# Patient Record
Sex: Male | Born: 1975 | Race: White | Hispanic: No | Marital: Married | Smoking: Former smoker
Health system: Southern US, Community
[De-identification: ages and names within clinical notes are randomized; demographics above are authoritative.]

## PROBLEM LIST (undated history)

## (undated) DIAGNOSIS — M109 Gout, unspecified: Secondary | ICD-10-CM

## (undated) DIAGNOSIS — E669 Obesity, unspecified: Secondary | ICD-10-CM

## (undated) DIAGNOSIS — E7800 Pure hypercholesterolemia, unspecified: Secondary | ICD-10-CM

## (undated) DIAGNOSIS — K219 Gastro-esophageal reflux disease without esophagitis: Secondary | ICD-10-CM

## (undated) HISTORY — PX: ANAL FISTULOTOMY: SHX6423

## (undated) HISTORY — PX: HERNIA REPAIR: SHX51

---

## 1999-09-23 ENCOUNTER — Emergency Department (HOSPITAL_COMMUNITY): Admission: EM | Admit: 1999-09-23 | Discharge: 1999-09-23 | Payer: Self-pay | Admitting: Emergency Medicine

## 1999-09-23 ENCOUNTER — Encounter: Payer: Self-pay | Admitting: Emergency Medicine

## 2013-01-25 ENCOUNTER — Emergency Department (HOSPITAL_COMMUNITY): Payer: BC Managed Care – PPO

## 2013-01-25 ENCOUNTER — Emergency Department (HOSPITAL_COMMUNITY)
Admission: EM | Admit: 2013-01-25 | Discharge: 2013-01-25 | Disposition: A | Discharge status: Home / Self Care | Payer: BC Managed Care – PPO | Attending: Emergency Medicine | Admitting: Emergency Medicine

## 2013-01-25 ENCOUNTER — Encounter (HOSPITAL_COMMUNITY): Payer: Self-pay | Admitting: *Deleted

## 2013-01-25 ENCOUNTER — Other Ambulatory Visit: Payer: Self-pay

## 2013-01-25 DIAGNOSIS — E7800 Pure hypercholesterolemia, unspecified: Secondary | ICD-10-CM | POA: Insufficient documentation

## 2013-01-25 DIAGNOSIS — R002 Palpitations: Secondary | ICD-10-CM

## 2013-01-25 DIAGNOSIS — R0602 Shortness of breath: Secondary | ICD-10-CM | POA: Insufficient documentation

## 2013-01-25 DIAGNOSIS — Z87891 Personal history of nicotine dependence: Secondary | ICD-10-CM | POA: Insufficient documentation

## 2013-01-25 DIAGNOSIS — R42 Dizziness and giddiness: Secondary | ICD-10-CM | POA: Insufficient documentation

## 2013-01-25 DIAGNOSIS — R079 Chest pain, unspecified: Secondary | ICD-10-CM | POA: Insufficient documentation

## 2013-01-25 DIAGNOSIS — Z79899 Other long term (current) drug therapy: Secondary | ICD-10-CM | POA: Insufficient documentation

## 2013-01-25 HISTORY — DX: Pure hypercholesterolemia, unspecified: E78.00

## 2013-01-25 LAB — HEPATIC FUNCTION PANEL
Bilirubin, Direct: 0.1 mg/dL (ref 0.0–0.3)
Indirect Bilirubin: 0.4 mg/dL (ref 0.3–0.9)

## 2013-01-25 LAB — CBC WITH DIFFERENTIAL/PLATELET
Basophils Relative: 1 % (ref 0–1)
Eosinophils Absolute: 0.1 10*3/uL (ref 0.0–0.7)
Hemoglobin: 16.3 g/dL (ref 13.0–17.0)
MCH: 32.1 pg (ref 26.0–34.0)
MCHC: 35.7 g/dL (ref 30.0–36.0)
Monocytes Absolute: 0.5 10*3/uL (ref 0.1–1.0)
Monocytes Relative: 11 % (ref 3–12)
Neutrophils Relative %: 66 % (ref 43–77)

## 2013-01-25 LAB — BASIC METABOLIC PANEL
BUN: 11 mg/dL (ref 6–23)
Calcium: 9.4 mg/dL (ref 8.4–10.5)
Creatinine, Ser: 0.95 mg/dL (ref 0.50–1.35)
GFR calc Af Amer: >90 mL/min (ref >90)
GFR calc non Af Amer: >90 mL/min (ref >90)
Potassium: 3.5 mEq/L (ref 3.5–5.1)

## 2013-01-25 MED ORDER — LORAZEPAM 1 MG PO TABS
1.0000 mg | ORAL_TABLET | Freq: Once | ORAL | Status: AC
  Administered 2013-01-25: 1 mg via ORAL
  Filled 2013-01-25: qty 1

## 2013-01-25 MED ORDER — METOPROLOL TARTRATE 25 MG PO TABS: ORAL_TABLET | ORAL | Status: DC

## 2013-01-25 NOTE — ED Notes (Signed)
Pt with mild SOB at this time

## 2013-01-25 NOTE — ED Provider Notes (Signed)
CSN: [id number]     Arrival date & time 01/25/13  1213 History    This chart was scribed for Marc Lennert, MD by Blanchard Kelch, ED Scribe. The patient was seen in room APA10/APA10. Patient's care was started at 1:23 PM.     Chief Complaint  Patient presents with  . Chest Pain  . Palpitations    Patient is a 37 y.o. male presenting with chest pain and palpitations. The history is provided by the patient. No language interpreter was used.  Chest Pain Pain radiates to the back: no   Pain severity:  Moderate Onset quality:  Sudden Duration:  3 weeks Timing:  Sporadic Progression:  Unchanged Chronicity:  New Associated symptoms: dizziness, palpitations and shortness of breath   Associated symptoms: no abdominal pain, no back pain, no cough, no fatigue and no headache   Palpitations Associated symptoms: dizziness and shortness of breath   Associated symptoms: no back pain, no chest pain and no cough     HPI Comments: Marc Black is a 37 y.o. male who presents to the Emergency Department complaining of ongoing intermittent episodes of dizziness with associated palpitations and shortness of breath, which began 2-3 weeks ago. The palpitation episodes last about 10 seconds and are described as "his heart flopping around " and the shortness of breath lasts up to a minute. He also complains of being light headed. Patient has noticed a few episodes of palpitations today while in the ED. Patient states that he stopped drinking coffee a month ago and reports drinking a 20 oz soda today. He is on medication for high cholesterol. Patient had stress test 5 years ago because of previous chest pain that he believes came out negative. Grandfather has history of heart issues and aunt had MI before she was 38, but other immediate family members are negative for any heart problems. Pt denies smoking and alcohol use.  PCP is Dr. Sherryll Burger.   Past Medical History  Diagnosis Date  . High cholesterol     Past Surgical History  Procedure Laterality Date  . Anal fistulotomy     History reviewed. No pertinent family history. History  Substance Use Topics  . Smoking status: Former Games developer  . Smokeless tobacco: Not on file  . Alcohol Use: No    Review of Systems  Constitutional: Negative for appetite change and fatigue.  HENT: Negative for congestion, sinus pressure and ear discharge.   Eyes: Negative for discharge.  Respiratory: Positive for shortness of breath. Negative for cough.   Cardiovascular: Positive for palpitations. Negative for chest pain.  Gastrointestinal: Negative for abdominal pain and diarrhea.  Genitourinary: Negative for frequency and hematuria.  Musculoskeletal: Negative for back pain.  Skin: Negative for rash.  Neurological: Positive for dizziness and light-headedness. Negative for seizures and headaches.  Psychiatric/Behavioral: Negative for hallucinations.    Allergies  Morphine and related  Home Medications   Current Outpatient Rx  Name  Route  Sig  Dispense  Refill  . gemfibrozil (LOPID) 600 MG tablet   Oral   Take 600 mg by mouth 2 (two) times daily before a meal.         . omeprazole (PRILOSEC) 20 MG capsule   Oral   Take 20 mg by mouth daily as needed. OTC medication          Triage Vitals: BP 146/86  Pulse 79  Resp 13  SpO2 100%  Physical Exam  Nursing note and vitals reviewed. Constitutional: He is oriented to  person, place, and time. He appears well-developed.  HENT:  Head: Normocephalic.  Eyes: Conjunctivae and EOM are normal. No scleral icterus.  Neck: Neck supple. No thyromegaly present.  Cardiovascular: Normal rate and regular rhythm.  Exam reveals no gallop and no friction rub.   No murmur heard. Pulmonary/Chest: No stridor. He has no wheezes. He has no rales. He exhibits no tenderness.  Abdominal: He exhibits no distension. There is no tenderness. There is no rebound.  Musculoskeletal: Normal range of motion. He exhibits  no edema.  Lymphadenopathy:    He has no cervical adenopathy.  Neurological: He is oriented to person, place, and time. Coordination normal.  Skin: No rash noted. No erythema.  Psychiatric: He has a normal mood and affect. His behavior is normal.    ED Course   DIAGNOSTIC STUDIES:  Oxygen Saturation is 100% on room air, normal by my interpretation.    COORDINATION OF CARE:  1:27 PM - Patient verbalizes understanding and agrees with treatment plan.   Procedures (including critical care time)  Labs Reviewed  BASIC METABOLIC PANEL - Abnormal; Notable for the following:    Glucose, Bld 105 (*)    All other components within normal limits  TROPONIN I  CBC WITH DIFFERENTIAL  HEPATIC FUNCTION PANEL   Dg Chest Portable 1 View  01/25/2013   *RADIOLOGY REPORT*  Clinical Data: Chest pain  PORTABLE CHEST - 1 VIEW  Comparison: None.  Findings: The heart and pulmonary vascularity are within normal limits.  The lungs are clear bilaterally.  No focal infiltrate is seen.  No bony abnormality is noted.  An old right clavicle fracture is seen.  IMPRESSION: No acute abnormality noted.   Original Report Authenticated By: Alcide Clever, M.D.   No diagnosis found.  Date: 01/25/2013  Rate: 74  Rhythm: normal sinus rhythm  QRS Axis: normal  Intervals: normal  ST/T Wave abnormalities: normal  Conduction Disutrbances:none  Narrative Interpretation:   Old EKG Reviewed: unchanged   MDM  Palpitatios,  Possible pac,  Will refer to cardiology The chart was scribed for me under my direct supervision.  I personally performed the history, physical, and medical decision making and all procedures in the evaluation of this patient.Marc Lennert, MD 01/25/13 [phone]

## 2013-01-25 NOTE — ED Notes (Signed)
Pt with CP and palpitations for 2 weeks with SOB with exertion at times, mid chest that radiates to back at times

## 2013-01-25 NOTE — ED Notes (Signed)
C/o dizziness at this time, denies CP

## 2013-08-21 ENCOUNTER — Encounter (HOSPITAL_COMMUNITY): Payer: Self-pay | Admitting: Emergency Medicine

## 2013-08-21 ENCOUNTER — Emergency Department (HOSPITAL_COMMUNITY): Payer: BC Managed Care – PPO

## 2013-08-21 ENCOUNTER — Emergency Department (HOSPITAL_COMMUNITY)
Admission: EM | Admit: 2013-08-21 | Discharge: 2013-08-21 | Disposition: A | Discharge status: Home / Self Care | Payer: BC Managed Care – PPO | Attending: Emergency Medicine | Admitting: Emergency Medicine

## 2013-08-21 DIAGNOSIS — M109 Gout, unspecified: Secondary | ICD-10-CM | POA: Insufficient documentation

## 2013-08-21 DIAGNOSIS — R1110 Vomiting, unspecified: Secondary | ICD-10-CM

## 2013-08-21 DIAGNOSIS — Z79899 Other long term (current) drug therapy: Secondary | ICD-10-CM | POA: Insufficient documentation

## 2013-08-21 DIAGNOSIS — K219 Gastro-esophageal reflux disease without esophagitis: Secondary | ICD-10-CM | POA: Insufficient documentation

## 2013-08-21 DIAGNOSIS — R0602 Shortness of breath: Secondary | ICD-10-CM

## 2013-08-21 DIAGNOSIS — E860 Dehydration: Secondary | ICD-10-CM

## 2013-08-21 DIAGNOSIS — R197 Diarrhea, unspecified: Secondary | ICD-10-CM | POA: Insufficient documentation

## 2013-08-21 DIAGNOSIS — R0789 Other chest pain: Secondary | ICD-10-CM | POA: Insufficient documentation

## 2013-08-21 DIAGNOSIS — E669 Obesity, unspecified: Secondary | ICD-10-CM | POA: Insufficient documentation

## 2013-08-21 DIAGNOSIS — R000 Tachycardia, unspecified: Secondary | ICD-10-CM | POA: Insufficient documentation

## 2013-08-21 DIAGNOSIS — J3489 Other specified disorders of nose and nasal sinuses: Secondary | ICD-10-CM | POA: Insufficient documentation

## 2013-08-21 DIAGNOSIS — E7800 Pure hypercholesterolemia, unspecified: Secondary | ICD-10-CM | POA: Insufficient documentation

## 2013-08-21 DIAGNOSIS — Z792 Long term (current) use of antibiotics: Secondary | ICD-10-CM | POA: Insufficient documentation

## 2013-08-21 DIAGNOSIS — R509 Fever, unspecified: Secondary | ICD-10-CM | POA: Insufficient documentation

## 2013-08-21 DIAGNOSIS — Z87891 Personal history of nicotine dependence: Secondary | ICD-10-CM | POA: Insufficient documentation

## 2013-08-21 HISTORY — DX: Gastro-esophageal reflux disease without esophagitis: K21.9

## 2013-08-21 HISTORY — DX: Gout, unspecified: M10.9

## 2013-08-21 HISTORY — DX: Obesity, unspecified: E66.9

## 2013-08-21 LAB — I-STAT CG4 LACTIC ACID, ED: Lactic Acid, Venous: 1.93 mmol/L (ref 0.5–2.2)

## 2013-08-21 LAB — BASIC METABOLIC PANEL
BUN: 13 mg/dL (ref 6–23)
CALCIUM: 9.4 mg/dL (ref 8.4–10.5)
CHLORIDE: 101 meq/L (ref 96–112)
CO2: 23 mEq/L (ref 19–32)
CREATININE: 1.09 mg/dL (ref 0.50–1.35)
GFR calc non Af Amer: 84 mL/min — ABNORMAL LOW (ref >90)
Glucose, Bld: 130 mg/dL — ABNORMAL HIGH (ref 70–99)
Potassium: 4.2 mEq/L (ref 3.7–5.3)
Sodium: 139 mEq/L (ref 137–147)

## 2013-08-21 LAB — CBC
HEMATOCRIT: 47.8 % (ref 39.0–52.0)
Hemoglobin: 17.5 g/dL — ABNORMAL HIGH (ref 13.0–17.0)
MCH: 33.1 pg (ref 26.0–34.0)
MCHC: 36.6 g/dL — AB (ref 30.0–36.0)
MCV: 90.4 fL (ref 78.0–100.0)
PLATELETS: 146 10*3/uL — AB (ref 150–400)
RBC: 5.29 MIL/uL (ref 4.22–5.81)
RDW: 12.6 % (ref 11.5–15.5)
WBC: 7.4 10*3/uL (ref 4.0–10.5)

## 2013-08-21 LAB — URINALYSIS, ROUTINE W REFLEX MICROSCOPIC
Bilirubin Urine: NEGATIVE
Glucose, UA: NEGATIVE mg/dL
Hgb urine dipstick: NEGATIVE
Ketones, ur: NEGATIVE mg/dL
LEUKOCYTES UA: NEGATIVE
NITRITE: NEGATIVE
PH: 5.5 (ref 5.0–8.0)
Protein, ur: NEGATIVE mg/dL
SPECIFIC GRAVITY, URINE: 1.029 (ref 1.005–1.030)
Urobilinogen, UA: 0.2 mg/dL (ref 0.0–1.0)

## 2013-08-21 LAB — HEPATIC FUNCTION PANEL
ALK PHOS: 93 U/L (ref 39–117)
ALT: 48 U/L (ref 0–53)
AST: 33 U/L (ref 0–37)
Albumin: 4.5 g/dL (ref 3.5–5.2)
BILIRUBIN TOTAL: 0.8 mg/dL (ref 0.3–1.2)
Bilirubin, Direct: <0.2 mg/dL (ref 0.0–0.3)
Total Protein: 7.3 g/dL (ref 6.0–8.3)

## 2013-08-21 LAB — I-STAT TROPONIN, ED
Troponin i, poc: 0 ng/mL (ref 0.00–0.08)
Troponin i, poc: 0 ng/mL (ref 0.00–0.08)

## 2013-08-21 LAB — D-DIMER, QUANTITATIVE: D-Dimer, Quant: 0.6 ug/mL-FEU — ABNORMAL HIGH (ref 0.00–0.48)

## 2013-08-21 MED ORDER — SODIUM CHLORIDE 0.9 % IV BOLUS (SEPSIS)
1000.0000 mL | Freq: Once | INTRAVENOUS | Status: AC
  Administered 2013-08-21: 1000 mL via INTRAVENOUS

## 2013-08-21 MED ORDER — ONDANSETRON 4 MG PO TBDP: ORAL_TABLET | ORAL | Status: DC

## 2013-08-21 MED ORDER — METRONIDAZOLE 500 MG PO TABS: 500.0000 mg | ORAL_TABLET | Freq: Three times a day (TID) | ORAL | Status: DC

## 2013-08-21 MED ORDER — CIPROFLOXACIN HCL 500 MG PO TABS: 500.0000 mg | ORAL_TABLET | Freq: Two times a day (BID) | ORAL | Status: DC

## 2013-08-21 MED ORDER — ONDANSETRON HCL 4 MG/2ML IJ SOLN
4.0000 mg | Freq: Once | INTRAMUSCULAR | Status: AC
  Administered 2013-08-21: 4 mg via INTRAVENOUS
  Filled 2013-08-21: qty 2

## 2013-08-21 MED ORDER — SODIUM CHLORIDE 0.9 % IV BOLUS (SEPSIS): 1000.0000 mL | Freq: Once | INTRAVENOUS | Status: DC

## 2013-08-21 MED ORDER — IOHEXOL 350 MG/ML SOLN
100.0000 mL | Freq: Once | INTRAVENOUS | Status: AC | PRN
  Administered 2013-08-21: 100 mL via INTRAVENOUS

## 2013-08-21 MED ORDER — IBUPROFEN 800 MG PO TABS
800.0000 mg | ORAL_TABLET | Freq: Once | ORAL | Status: AC
  Administered 2013-08-21: 800 mg via ORAL
  Filled 2013-08-21: qty 1

## 2013-08-21 MED ORDER — ACETAMINOPHEN 500 MG PO TABS
1000.0000 mg | ORAL_TABLET | Freq: Once | ORAL | Status: AC
  Administered 2013-08-21: 1000 mg via ORAL
  Filled 2013-08-21: qty 2

## 2013-08-21 MED ORDER — ASPIRIN 81 MG PO CHEW
324.0000 mg | CHEWABLE_TABLET | Freq: Once | ORAL | Status: AC
  Administered 2013-08-21: 324 mg via ORAL
  Filled 2013-08-21: qty 4

## 2013-08-21 NOTE — Discharge Instructions (Signed)
Take zofran for vomiting/ nausea. Stay well hydrated. Talk to your doctor regarding C diff culture result, if your C diff culture comes back positive then talk to your doctor And take cipro and flagyl as directed. If negative do not take those antibiotics.  If you were given medicines take as directed.  If you are on coumadin or contraceptives realize their levels and effectiveness is altered by many different medicines.  If you have any reaction (rash, tongues swelling, other) to the medicines stop taking and see a physician.   Please follow up as directed and return to the ER or see a physician for new or worsening symptoms.  Thank you. Filed Vitals:   08/21/13 1700 08/21/13 1800 08/21/13 1851 08/21/13 1955  BP: 105/55 99/57  101/48  Pulse: 107 118 111   Temp:  102.8 F (39.3 C)  99 F (37.2 C)  TempSrc:  Oral  Oral  Resp: 20 21 19 15   SpO2: 97% 100% 96% 99%

## 2013-08-21 NOTE — ED Notes (Signed)
Pt given sprite for PO challenge

## 2013-08-21 NOTE — ED Notes (Signed)
Patient transported to CT 

## 2013-08-21 NOTE — ED Provider Notes (Addendum)
CSN: [id number]     Arrival date & time 08/21/13  1234 History   First MD Initiated Contact with Patient 08/21/13 1530     Chief Complaint  Patient presents with  . Chest Pain  . Emesis  . Shortness of Breath     (Consider location/radiation/quality/duration/timing/severity/associated sxs/prior Treatment) HPI Comments: 38 yo male with cholesterol and FH cardiac hx presents with sob and lightheaded with recurrent vomiting.  Pt has had various sxs for one month.  He had an ear/ throat infection that evolved into chest infection/ cough over the past 3-4 wks.  Pt has been on two different abx for it.  Today multiple vomiting episodes, non bloody.  No focal abdo pain.  Pt has had intermittent chest tightness the past week, random timing however at times it is with exertion and has mild sob.  Patient denies blood clot history, active cancer, recent major trauma or surgery, unilateral leg swelling/ pain, hemoptysis. Low grade fevers recently.  No known cardiac hx. Mild left arm "funny feeling".  No radiation of tightness, lasts a few seconds then goes away.  Stress test 4 yrs ago okay.   Patient is a 38 y.o. male presenting with chest pain, vomiting, and shortness of breath. The history is provided by the patient.  Chest Pain Associated symptoms: cough, fatigue, fever, nausea, shortness of breath and vomiting   Associated symptoms: no abdominal pain, no back pain and no headache   Emesis Associated symptoms: chills   Associated symptoms: no abdominal pain and no headaches   Shortness of Breath Associated symptoms: chest pain, cough, fever and vomiting   Associated symptoms: no abdominal pain, no headaches, no neck pain and no rash     Past Medical History  Diagnosis Date  . High cholesterol   . GERD (gastroesophageal reflux disease)   . Gout   . Obese    Past Surgical History  Procedure Laterality Date  . Anal fistulotomy    . Hernia repair     No family history on file. History   Substance Use Topics  . Smoking status: Former Games developer  . Smokeless tobacco: Not on file  . Alcohol Use: No    Review of Systems  Constitutional: Positive for fever, chills, appetite change and fatigue.  HENT: Positive for congestion.   Eyes: Negative for visual disturbance.  Respiratory: Positive for cough, chest tightness and shortness of breath.   Cardiovascular: Positive for chest pain.  Gastrointestinal: Positive for nausea and vomiting. Negative for abdominal pain.  Genitourinary: Negative for dysuria and flank pain.  Musculoskeletal: Negative for back pain, neck pain and neck stiffness.  Skin: Negative for rash.  Neurological: Positive for light-headedness. Negative for headaches.      Allergies  Morphine and related  Home Medications   Current Outpatient Rx  Name  Route  Sig  Dispense  Refill  . amoxicillin (AMOXIL) 500 MG capsule   Oral   Take 500 mg by mouth 3 (three) times daily.         . colchicine 0.6 MG tablet   Oral   Take 0.6 mg by mouth daily as needed (for gout).          Marland Kitchen gemfibrozil (LOPID) 600 MG tablet   Oral   Take 600 mg by mouth 2 (two) times daily before a meal.         . guaiFENesin (ROBITUSSIN) 100 MG/5ML liquid   Oral   Take 200 mg by mouth 3 (three) times daily as  needed for cough.         Marland Kitchen ibuprofen (ADVIL,MOTRIN) 200 MG tablet   Oral   Take 400 mg by mouth every 6 (six) hours as needed for moderate pain.          Marland Kitchen omeprazole (PRILOSEC) 20 MG capsule   Oral   Take 20 mg by mouth 2 (two) times daily before a meal. OTC medication         . promethazine (PHENERGAN) 25 MG tablet   Oral   Take 25 mg by mouth every 6 (six) hours as needed for nausea or vomiting.         . Pseudoephedrine-Ibuprofen (ADVIL COLD/SINUS) 30-200 MG TABS   Oral   Take 2 capsules by mouth daily as needed (for cold).          BP 126/53  Pulse 116  Temp(Src) 99 F (37.2 C) (Oral)  Resp 29  SpO2 95% Physical Exam  Nursing note and  vitals reviewed. Constitutional: He is oriented to person, place, and time. He appears well-developed and well-nourished.  HENT:  Head: Normocephalic and atraumatic.  Dry mm  Eyes: Conjunctivae are normal. Right eye exhibits no discharge. Left eye exhibits no discharge.  Neck: Normal range of motion. Neck supple. No tracheal deviation present.  Cardiovascular: Regular rhythm.  Tachycardia present.   No murmur heard. Pulmonary/Chest: Effort normal and breath sounds normal.  Abdominal: Soft. He exhibits no distension. There is no tenderness. There is no guarding.  Musculoskeletal: He exhibits no edema.  Neurological: He is alert and oriented to person, place, and time. No cranial nerve deficit.  Skin: Skin is warm. No rash noted.  Psychiatric: He has a normal mood and affect.    ED Course  Procedures (including critical care time)  EMERGENCY DEPARTMENT BILIARY ULTRASOUND INTERPRETATION "Study: Limited Abdominal Ultrasound of the gallbladder and common bile duct."  INDICATIONS: Nausea and Vomiting Indication: Multiple views of the gallbladder and common bile duct were obtained in real-time with a Multi-frequency probe." PERFORMED BY:  Myself IMAGES ARCHIVED?: Yes FINDINGS: Gallstones absent, Gallbladder wall normal in thickness and Sonographic Murphy's sign absent LIMITATIONS: Bowel Gas INTERPRETATION: Normal   Labs Review Labs Reviewed  CBC - Abnormal; Notable for the following:    Hemoglobin 17.5 (*)    MCHC 36.6 (*)    Platelets 146 (*)    All other components within normal limits  BASIC METABOLIC PANEL - Abnormal; Notable for the following:    Glucose, Bld 130 (*)    GFR calc non Af Amer 84 (*)    All other components within normal limits  D-DIMER, QUANTITATIVE - Abnormal; Notable for the following:    D-Dimer, Quant 0.60 (*)    All other components within normal limits  CLOSTRIDIUM DIFFICILE BY PCR  HEPATIC FUNCTION PANEL  URINALYSIS, ROUTINE W REFLEX MICROSCOPIC   I-STAT TROPOININ, ED  I-STAT TROPOININ, ED  I-STAT CG4 LACTIC ACID, ED   Imaging Review Dg Chest 2 View  08/21/2013   CLINICAL DATA:  Shortness of breath and chest pain  EXAM: CHEST  2 VIEW  COMPARISON:  August 14, 2013  FINDINGS: The lungs are clear. The heart size and pulmonary vascularity are normal. No adenopathy. No pneumothorax. There is an old healed fracture of the right clavicle.  IMPRESSION: No edema or consolidation.   Electronically Signed   By: Bretta Bang M.D.   On: 08/21/2013 13:32   Ct Angio Chest Pe W/cm &/or Wo Cm  08/21/2013   CLINICAL  DATA:  Chest pain. Tachycardia. Shortness of breath. Elevated D-dimer  EXAM: CT ANGIOGRAPHY CHEST WITH CONTRAST  TECHNIQUE: Multidetector CT imaging of the chest was performed using the standard protocol during bolus administration of intravenous contrast. Multiplanar CT image reconstructions and MIPs were obtained to evaluate the vascular anatomy.  CONTRAST:  100mL OMNIPAQUE IOHEXOL 350 MG/ML SOLN  COMPARISON:  DG CHEST 2 VIEW dated 08/21/2013  FINDINGS: Suboptimal contrast timing leads to reduce sensitivity for small pulmonary emboli. No central or segmental emboli are present. No acute aortic finding. Right hilar lymph node short axis diameter 1.0 cm. Right infrahilar lymph node short axis diameter 1.0 cm.  Diffuse hepatic steatosis.  No additional adenopathy in the chest.  There is mild enlargement of the heart.  No pericardial effusion.  The lungs appear clear.  Review of the MIP images confirms the above findings.  IMPRESSION: 1. No embolus identified. Sensitivity for subsegmental emboli is reduced due to mildly dilute contrast bolus. outside of a very high clinical suspicion for pulmonary emboli, the the renal and radiation related drawbacks to a second contrast bolus and second scan probably outweigh the benefit of possible improvement in sensitivity. 2. Mild cardiomegaly. 3. Diffuse hepatic steatosis. 4. Borderline right hilar adenopathy, of  uncertain significance.   Electronically Signed   By: Herbie BaltimoreWalt  Liebkemann M.D.   On: 08/21/2013 19:10     EKG Interpretation   Date/Time:  Wednesday August 21 2013 12:38:46 EDT Ventricular Rate:  114 PR Interval:  178 QRS Duration: 88 QT Interval:  312 QTC Calculation: 430 R Axis:   24 Text Interpretation:  Sinus tachycardia Low voltage QRS Septal infarct ,  age undetermined Abnormal ECG Confirmed by Quinetta Shilling  MD, Aryella Besecker ([id number]) on  08/21/2013 3:30:23 PM     Repeat EKG.  Date: 08/21/2013  Rate: 114  Rhythm: sinus tachycardia  QRS Axis: normal  Intervals: normal  ST/T Wave abnormalities: nonspecific T wave changes  Conduction Disutrbances:none  Narrative Interpretation:   Old EKG Reviewed: changes noted   MDM   Final diagnoses:  Shortness of breath  Diarrhea  Vomiting  Dehydration  Fever    Multiple sxs -  Sob and vomiting primary sxs.  Dehydrated clinically. With intermittent exertional sob cardiac/ PE work up in the ED. Non specific EKG changes, poor baseline, repeat ordered with delta troponin. D dimer as low risk. Fluids IV and asa.  No cp currently.  Multiple rechecks/ discussions. CT no PE.   Pt improved clinically, vitals improved, tolerated po.  Pt developed fever, tylenol given and recurrent diarrhea in ED, C diff was not collected unfortunately with recent abx. Discussed obs vs close outpt fup.  Pt decided for close outpt fup, strict reasons to return given. Pt will fup outpt for C diff testing.   Pt walked in the ED without difficulty. Clinically severe GE. Bedside US no acute gallstones/ wall thickening.  Results and differential diagnosis were discussed with the patient. Close follow up outpatient was discussed, patient comfortable with the plan.   Filed Vitals:   08/21/13 1800 08/21/13 1851 08/21/13 1955 08/21/13 2101  BP: 99/57  101/48 112/53  Pulse: 118 111  108  Temp: 102.8 F (39.3 C)  99 F (37.2 C) 99.5 F (37.5 C)  TempSrc: Oral  Oral Oral   Resp: 21 19 15 20   SpO2: 100% 96% 99% 100%      Instructed patient to only take abx if C diff test positive.  Enid SkeensJoshua M Nicle Connole, MD 08/22/13 [id number]  Araceli BoucheJoshua M  Jodi Mourning, MD 08/22/13 [id number]  Enid Skeens, MD 08/29/13 1335

## 2013-08-21 NOTE — ED Notes (Signed)
Pt states over the last month he's had ear infection that went to chest infection and back to ear infection.  Pt states he's on his 2nd round of antibiotics.  Pt states that he has been having exertional SOB and dizziness.  Pt states this morning he began vomiting, having a "funny feeling in L arm", and CP.  Pt alert and oriented in triage and in NAD.

## 2013-08-21 NOTE — ED Notes (Signed)
Pt unable to have BM for stool sample at this time. Pt given specimen cup for stool sample collection to take to DR office tomorrow. , MD aware pts HR is 108.

## 2015-03-02 IMAGING — CT CT ANGIO CHEST
2 of 9 series · 18 of 46 positions shown · IV contrast (APPLIED)
Comparison: DG CHEST 2 VIEW dated 08/21/2013

CLINICAL DATA: Chest pain. Tachycardia. Shortness of breath.
Elevated D-dimer

EXAM:
CT ANGIOGRAPHY CHEST WITH CONTRAST
TECHNIQUE: Multidetector CT imaging of the chest was performed using the
standard protocol during bolus administration of intravenous
contrast. Multiplanar CT image reconstructions and MIPs were
obtained to evaluate the vascular anatomy.
CONTRAST:  100mL OMNIPAQUE IOHEXOL 350 MG/ML SOLN

[Series 5: thins · axial · 0.76mm/px · z∈[+1258,+1510]mm · 15 of 284 slices shown]
[im 16/284  lung]
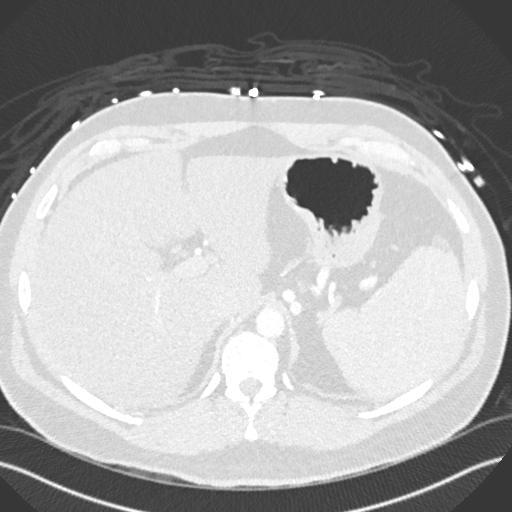
[im 32/284  soft-tissue]
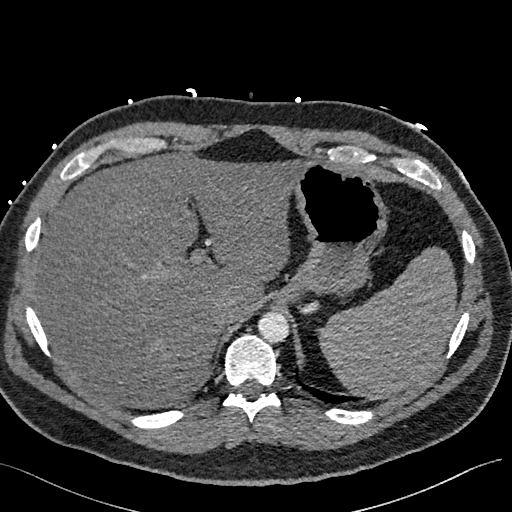
[im 48/284  lung]
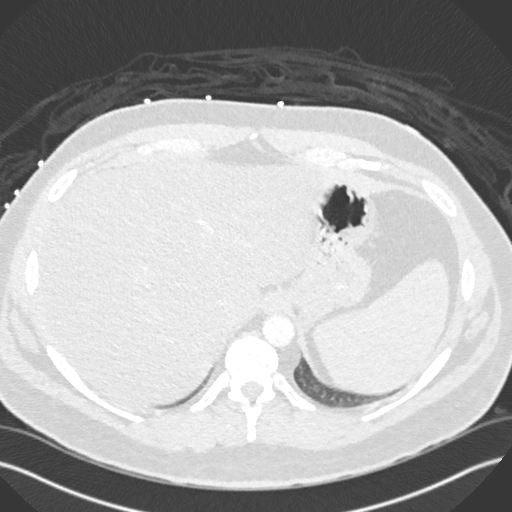
[im 63/284  soft-tissue]
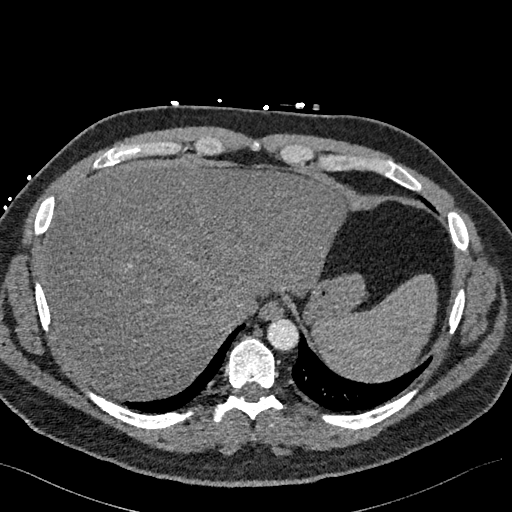
[im 95/284  lung]
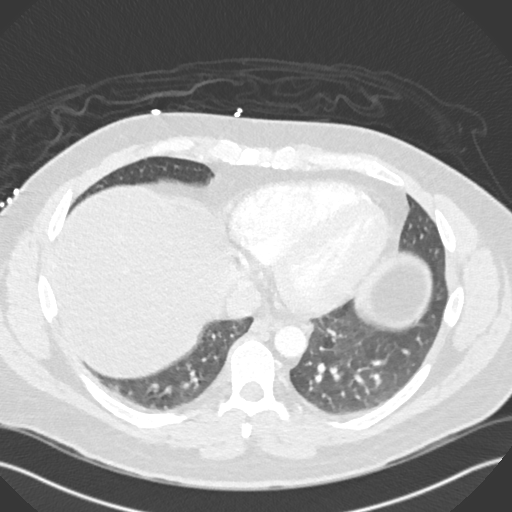
[im 111/284  soft-tissue]
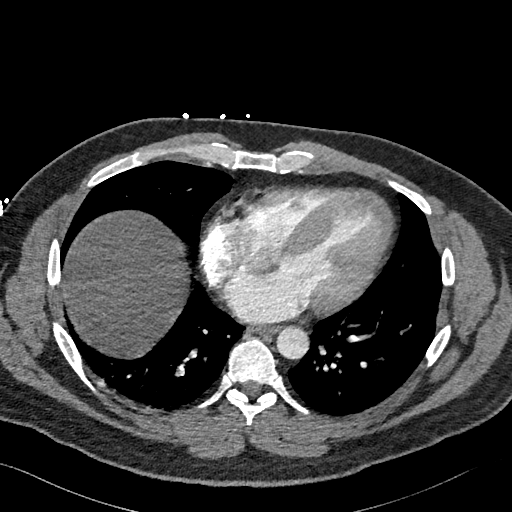
[im 126/284  lung]
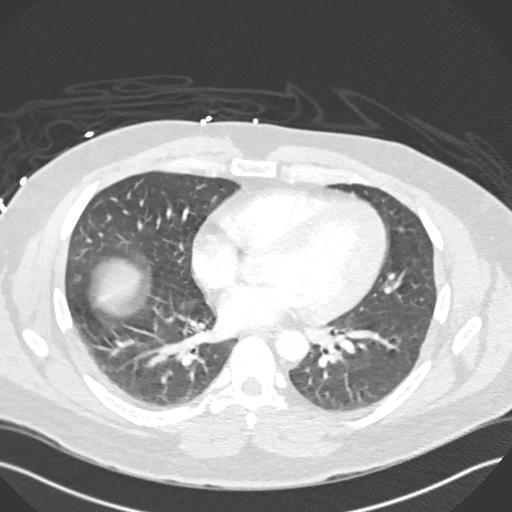
[im 142/284  soft-tissue]
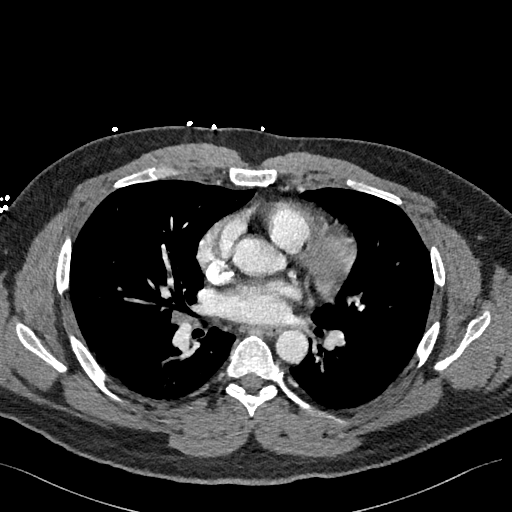
[im 158/284  lung]
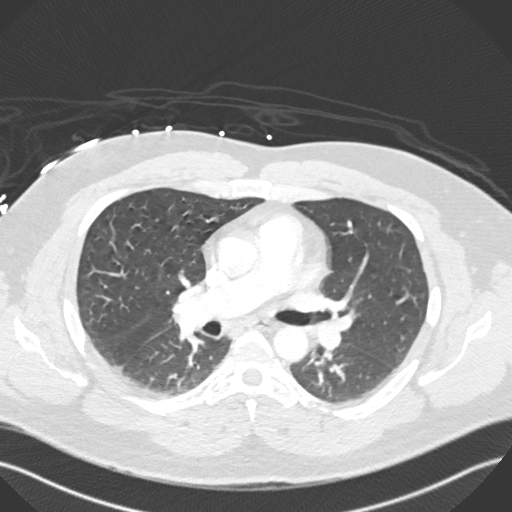
[im 173/284  soft-tissue]
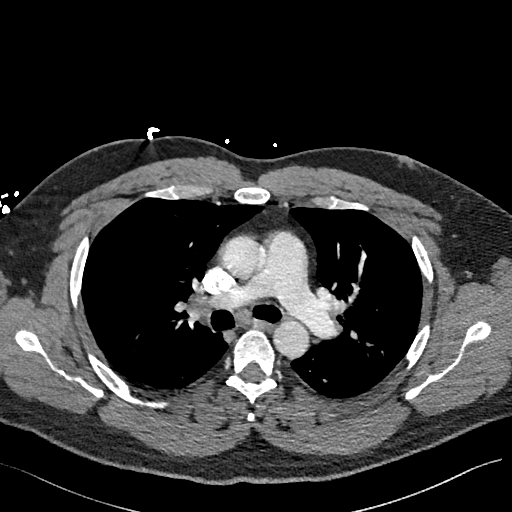
[im 189/284  lung]
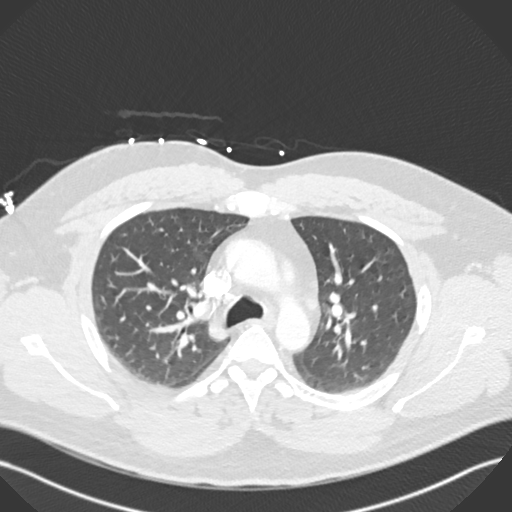
[im 221/284  soft-tissue]
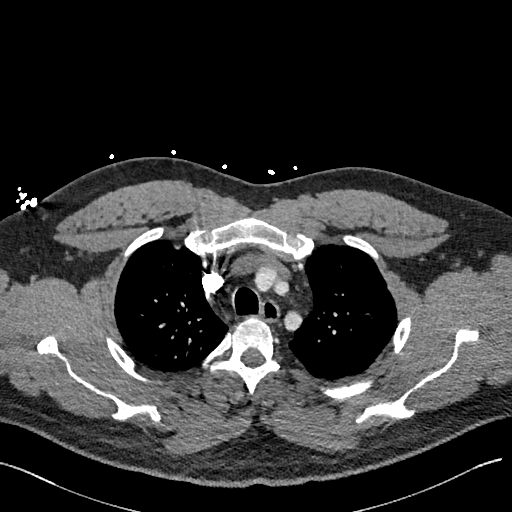
[im 236/284  lung]
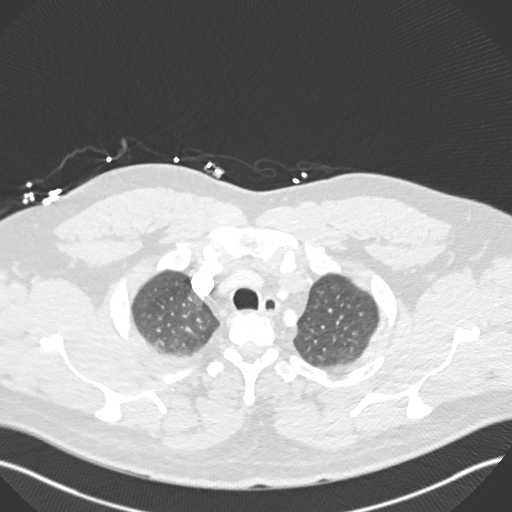
[im 252/284  soft-tissue]
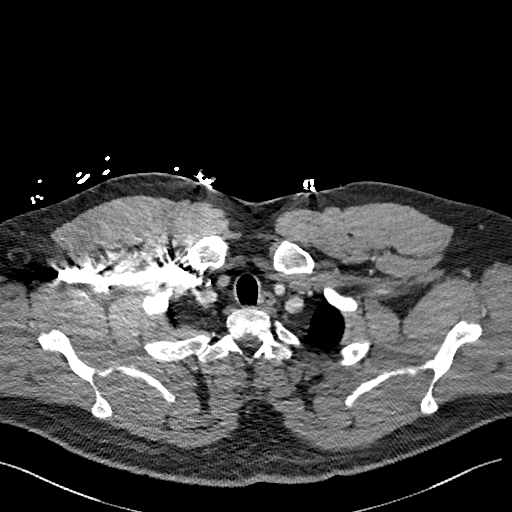
[im 268/284  lung]
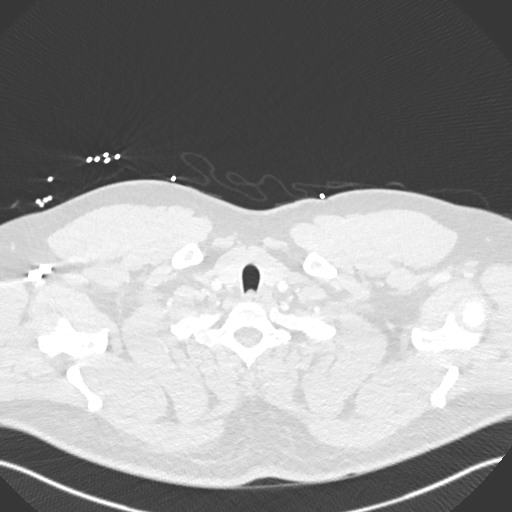

[Series 7: coronal mpr · coronal · 0.54mm/px · 3 of 119 slices shown]
[im 30/119  soft-tissue]
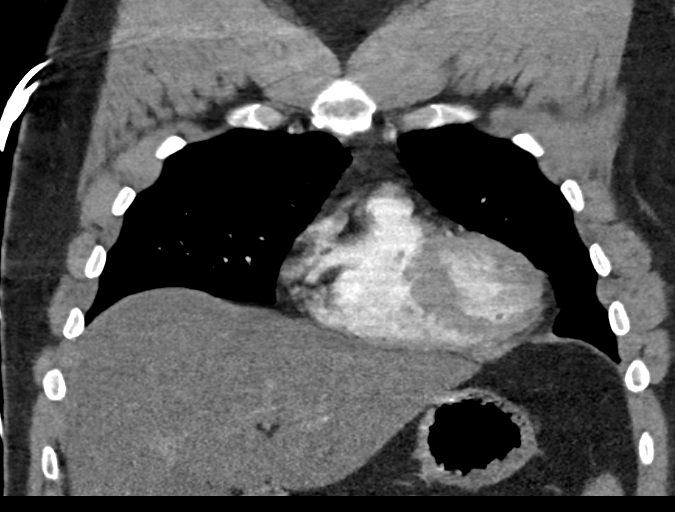
[im 60/119  soft-tissue]
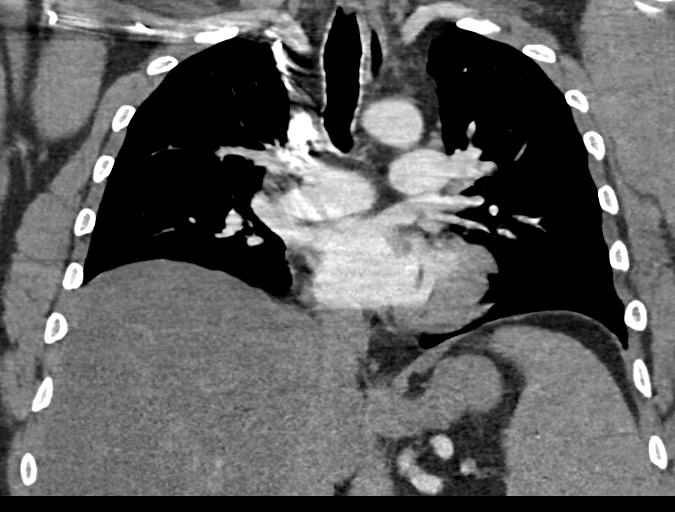
[im 89/119  soft-tissue]
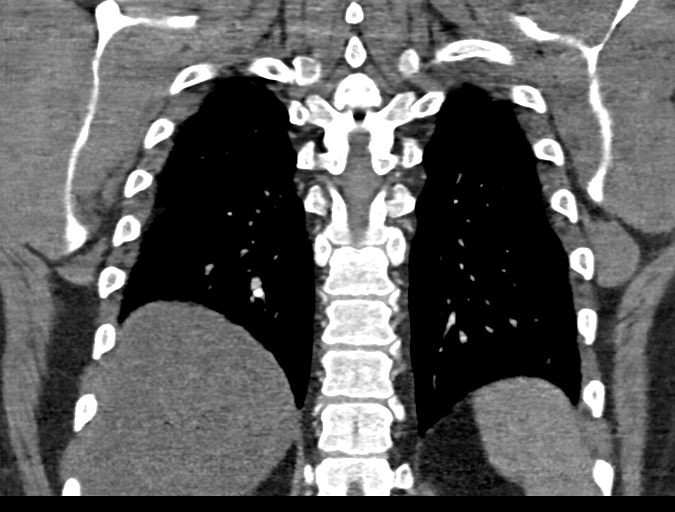

[18 of 46 positions shown; findings below may reference images not displayed]

FINDINGS: Suboptimal contrast timing leads to reduce sensitivity for small
pulmonary emboli. No central or segmental emboli are present. No
acute aortic finding. Right hilar lymph node short axis diameter
cm. Right infrahilar lymph node short axis diameter 1.0 cm.

Diffuse hepatic steatosis.  No additional adenopathy in the chest.

There is mild enlargement of the heart.  No pericardial effusion.

The lungs appear clear.

Review of the MIP images confirms the above findings.
IMPRESSION: 1. No embolus identified. Sensitivity for subsegmental emboli is
reduced due to mildly dilute contrast bolus. outside of a very high
clinical suspicion for pulmonary emboli, the the renal and radiation
related drawbacks to a second contrast bolus and second scan
probably outweigh the benefit of possible improvement in
sensitivity.
2. Mild cardiomegaly.
3. Diffuse hepatic steatosis.
4. Borderline right hilar adenopathy, of uncertain significance.

## 2017-06-15 MED FILL — GEMFIBROZIL 600 MG TABLET: 600 | 30 days supply | Qty: 60 | Fill #0

## 2017-07-21 MED FILL — VASCEPA 1 GM CAPSULE: 1 | 30 days supply | Qty: 120 | Fill #0

## 2017-07-21 MED FILL — GEMFIBROZIL 600 MG TABLET: 600 | 30 days supply | Qty: 60 | Fill #1

## 2017-08-22 MED FILL — GEMFIBROZIL 600 MG TABLET: 600 | 30 days supply | Qty: 60 | Fill #2

## 2017-08-22 MED FILL — VASCEPA 1 GM CAPSULE: 1 | 30 days supply | Qty: 120 | Fill #1

## 2017-09-19 MED FILL — VASCEPA 1 GM CAPSULE: 1 | 30 days supply | Qty: 120 | Fill #2

## 2017-09-19 MED FILL — GEMFIBROZIL 600 MG TABLET: 600 | 30 days supply | Qty: 60 | Fill #3

## 2017-10-25 MED FILL — VASCEPA 1 GM CAPSULE: 1 | 30 days supply | Qty: 120 | Fill #3

## 2017-10-26 MED FILL — GEMFIBROZIL 600 MG TABLET: 600 | 30 days supply | Qty: 60 | Fill #0

## 2017-11-21 MED FILL — AMOX-CLAV 875-125 MG TABLET: 875-125 | 10 days supply | Qty: 20 | Fill #0

## 2017-11-21 MED FILL — PROAIR HFA 90 MCG INHALER: 108 (90 BAS | 33 days supply | Qty: 9 | Fill #0

## 2017-11-21 MED FILL — POLYMYXIN B/TMP EYE DROPS: 10000-0.1 | 17 days supply | Qty: 10 | Fill #0

## 2017-11-27 MED FILL — GEMFIBROZIL 600 MG TABLET: 600 | 30 days supply | Qty: 60 | Fill #1

## 2017-12-04 MED FILL — VASCEPA 1 GM CAPSULE: 1 | 30 days supply | Qty: 120 | Fill #0

## 2017-12-25 MED FILL — metFORMIN HCL 500 MG TABS: 500 | 30 days supply | Qty: 30 | Fill #0

## 2018-01-10 MED FILL — VASCEPA 1 GM CAPSULE: 1 | 30 days supply | Qty: 120 | Fill #1

## 2018-01-17 MED FILL — metFORMIN HCL 500 MG TABS: 500 | 30 days supply | Qty: 30 | Fill #1

## 2018-02-04 ENCOUNTER — Ambulatory Visit (HOSPITAL_COMMUNITY)
Admission: EM | Admit: 2018-02-04 | Discharge: 2018-02-04 | Disposition: A | Discharge status: Home / Self Care | Payer: No Typology Code available for payment source | Attending: Family Medicine | Admitting: Family Medicine

## 2018-02-04 ENCOUNTER — Encounter (HOSPITAL_COMMUNITY): Payer: Self-pay

## 2018-02-04 ENCOUNTER — Other Ambulatory Visit: Payer: Self-pay

## 2018-02-04 DIAGNOSIS — S96812A Strain of other specified muscles and tendons at ankle and foot level, left foot, initial encounter: Secondary | ICD-10-CM

## 2018-02-04 NOTE — Discharge Instructions (Addendum)
Ice to area for 20 minutes every 2-4 hours Ibuprofen 800 mg 3 times a day with food Rest.  Limit weightbearing.  Elevate leg. Follow-up with your primary care doctor next week.  He might benefit from physical therapy.

## 2018-02-04 NOTE — ED Triage Notes (Signed)
Pt right lower leg pain x 1 day

## 2018-02-04 NOTE — ED Provider Notes (Signed)
[facility]    CSN: [id number] Arrival date & time: 02/04/18  1534     History   Chief Complaint Chief Complaint  Patient presents with  . Leg Injury    HPI Marc Black is a 42 y.o. male.   HPI  Patient was running yesterday and felt a sudden pop and pain in the back of his calf.  It swollen and painful.  He is using ice and ibuprofen.  He is here to have this evaluated.  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  He may not have warmed up completely.  He is never had this problem before.  Past Medical History:  Diagnosis Date  . GERD (gastroesophageal reflux disease)   . Gout   . High cholesterol   . Obese     There are no active problems to display for this patient.   Past Surgical History:  Procedure Laterality Date  . ANAL FISTULOTOMY    . HERNIA REPAIR         Home Medications    Prior to Admission medications   Medication Sig Start Date End Date Taking? Authorizing Provider  gemfibrozil (LOPID) 600 MG tablet Take 600 mg by mouth 2 (two) times daily before a meal.    [provider]  ibuprofen (ADVIL,MOTRIN) 200 MG tablet Take 400 mg by mouth every 6 (six) hours as needed for moderate pain.     [provider]  omeprazole (PRILOSEC) 20 MG capsule Take 20 mg by mouth 2 (two) times daily before a meal. OTC medication    [provider]    Family History History reviewed. No pertinent family history.  Social History Social History   Tobacco Use  . Smoking status: Former Games developer  . Smokeless tobacco: Former Engineer, water Use Topics  . Alcohol use: No  . Drug use: No     Allergies   Morphine and related   Review of Systems Review of Systems  Constitutional: Negative for chills and fever.  HENT: Negative for ear pain and sore throat.   Eyes: Negative for pain and visual disturbance.  Respiratory: Negative for cough and shortness of breath.   Cardiovascular: Negative for chest  pain and palpitations.  Gastrointestinal: Negative for abdominal pain and vomiting.  Genitourinary: Negative for dysuria and hematuria.  Musculoskeletal: Positive for gait problem. Negative for arthralgias and back pain.  Skin: Negative for color change and rash.  Neurological: Negative for seizures and syncope.  All other systems reviewed and are negative.    Physical Exam Triage Vital Signs ED Triage Vitals  Enc Vitals Group     BP 02/04/18 1556 (!) 139/92     Pulse Rate 02/04/18 1556 76     Resp 02/04/18 1556 18     Temp 02/04/18 1556 97.8 F (36.6 C)     Temp src --      SpO2 02/04/18 1556 100 %     Weight 02/04/18 1553 255 lb (115.7 kg)     Height --      Head Circumference --      Peak Flow --      Pain Score --      Pain Loc --      Pain Edu? --      Excl. in GC? --    No data found.  Updated Vital Signs BP (!) 139/92   Pulse 76   Temp 97.8 F (36.6 C)   Resp 18  Wt 115.7 kg   SpO2 100%   Visual Acuity Right Eye Distance:   Left Eye Distance:   Bilateral Distance:    Right Eye Near:   Left Eye Near:    Bilateral Near:     Physical Exam  Constitutional: He appears well-developed and well-nourished. No distress.  HENT:  Head: Normocephalic and atraumatic.  Mouth/Throat: Oropharynx is clear and moist.  Eyes: Pupils are equal, round, and reactive to light. Conjunctivae are normal.  Neck: Normal range of motion.  Cardiovascular: Normal rate.  Pulmonary/Chest: Effort normal. No respiratory distress.  Abdominal: Soft. He exhibits no distension.  Musculoskeletal: Normal range of motion. He exhibits no edema.  Right calf is mildly swollen.  Lateral calf is tender, especially at the popliteal fossa.  No ecchymosis.  No spasm.  Patient walks with an antalgic gait.  He can lift both heels off of the floor briefly.  Neurological: He is alert.  Skin: Skin is warm and dry.  Psychiatric: He has a normal mood and affect. His behavior is normal.     UC  Treatments / Results  Labs (all labs ordered are listed, but only abnormal results are displayed) Labs Reviewed - No data to display  EKG None  Radiology No results found.  Procedures Procedures (including critical care time)  Medications Ordered in UC Medications - No data to display  Initial Impression / Assessment and Plan / UC Course  I have reviewed the triage vital signs and the nursing notes.  Pertinent labs & imaging results that were available during my care of the patient were reviewed by me and considered in my medical decision making (see chart for details).     Discussed plantaris muscle rupture.  May be 6 to 8 weeks to heal completely.  He can probably go back to work sooner than that.  He needs ice, rest, anti-inflammatories.  Follow-up with his primary care doctor next week to get started in physical therapy. Final Clinical Impressions(s) / UC Diagnoses   Final diagnoses:  Rupture of left plantaris tendon, initial encounter     Discharge Instructions     Ice to area for 20 minutes every 2-4 hours Ibuprofen 800 mg 3 times a day with food Rest.  Limit weightbearing.  Elevate leg. Follow-up with your primary care doctor next week.  He might benefit from physical therapy.   ED Prescriptions    None     Controlled Substance Prescriptions [place] Controlled Substance Registry consulted? Not Applicable   Eustace Moore, MD 02/04/18 1650

## 2018-02-14 MED FILL — metFORMIN HCL 500 MG TABS: 500 | 30 days supply | Qty: 30 | Fill #2

## 2018-02-14 MED FILL — VASCEPA 1 GM CAPSULE: 1 | 30 days supply | Qty: 120 | Fill #2

## 2018-03-16 MED FILL — metFORMIN HCL 500 MG TABS: 500 | 30 days supply | Qty: 30 | Fill #3

## 2018-03-16 MED FILL — VASCEPA 1 GM CAPSULE: 1 | 30 days supply | Qty: 120 | Fill #0

## 2018-04-25 MED FILL — VASCEPA 1 GM CAPSULE: 1 | 30 days supply | Qty: 120 | Fill #1

## 2018-04-25 MED FILL — metFORMIN HCL 500 MG TABS: 500 | 30 days supply | Qty: 30 | Fill #4

## 2018-05-28 MED FILL — VASCEPA 1 GM CAPSULE: 1 | 30 days supply | Qty: 120 | Fill #2

## 2018-05-28 MED FILL — metFORMIN HCL 500 MG TABS: 500 | 30 days supply | Qty: 30 | Fill #5

## 2018-06-08 MED FILL — metroNIDAZOLE 500 MG TABS: 500 | 10 days supply | Qty: 40 | Fill #0

## 2018-06-08 MED FILL — SULFAMETHOXAZOLE-TMP DS TAB: 800-160 | 10 days supply | Qty: 20 | Fill #0

## 2018-08-01 ENCOUNTER — Other Ambulatory Visit: Payer: Self-pay | Admitting: Adult Health Nurse Practitioner

## 2018-08-01 MED FILL — metFORMIN HCL 500 MG TABS: 500 | 30 days supply | Qty: 30 | Fill #6

## 2018-08-01 MED FILL — VASCEPA 1 GM CAPSULE: 1 | 30 days supply | Qty: 120 | Fill #0

## 2018-09-26 MED FILL — metFORMIN HCL ER 500 MG TB2: 500 | 90 days supply | Qty: 90 | Fill #0

## 2018-09-26 MED FILL — VASCEPA 1 GM CAPSULE: 1 | 30 days supply | Qty: 120 | Fill #0

## 2018-12-19 MED FILL — VASCEPA 1 GM CAPSULE: 1 | 30 days supply | Qty: 120 | Fill #1

## 2019-02-04 MED FILL — VASCEPA 1 GM CAPSULE: 1 | 30 days supply | Qty: 120 | Fill #2

## 2019-03-26 MED FILL — VASCEPA 1 GM CAPSULE: 1 | 30 days supply | Qty: 120 | Fill #3

## 2019-05-21 MED FILL — VASCEPA 1 GM CAPSULE: 1 | 30 days supply | Qty: 120 | Fill #1

## 2019-05-22 MED FILL — FENOFIBRATE 160 MG TABLET: 160 | 30 days supply | Qty: 30 | Fill #0

## 2019-06-24 MED FILL — VASCEPA 1 GM CAPSULE: 1 | 30 days supply | Qty: 120 | Fill #2

## 2019-06-24 MED FILL — FENOFIBRATE 160 MG TABLET: 160 | 30 days supply | Qty: 30 | Fill #1

## 2019-07-19 MED FILL — FENOFIBRATE 160 MG TABLET: 160 | 30 days supply | Qty: 30 | Fill #2

## 2019-07-19 MED FILL — VASCEPA 1 GM CAPSULE: 1 | 30 days supply | Qty: 120 | Fill #3

## 2019-07-26 ENCOUNTER — Ambulatory Visit
Admission: EM | Admit: 2019-07-26 | Discharge: 2019-07-26 | Disposition: A | Discharge status: Home / Self Care | Payer: No Typology Code available for payment source

## 2019-07-26 ENCOUNTER — Other Ambulatory Visit: Payer: Self-pay

## 2019-07-26 DIAGNOSIS — H8110 Benign paroxysmal vertigo, unspecified ear: Secondary | ICD-10-CM

## 2019-07-26 DIAGNOSIS — M109 Gout, unspecified: Secondary | ICD-10-CM

## 2019-07-26 DIAGNOSIS — M25561 Pain in right knee: Secondary | ICD-10-CM

## 2019-07-26 MED ORDER — DEXAMETHASONE SODIUM PHOSPHATE 10 MG/ML IJ SOLN
10.0000 mg | Freq: Once | INTRAMUSCULAR | Status: AC
  Administered 2019-07-26: 10 mg via INTRAMUSCULAR

## 2019-07-26 NOTE — ED Provider Notes (Signed)
[place]   [id number] 07/26/19 Arrival Time: 1316  CC: RT knee and vertigo  SUBJECTIVE: History from: patient. Kelsie Kramp is a 44 y.o. male complains of RT knee pain that began few days ago.  Denies a precipitating event or specific injury.  However, was working out in the gym prior to symptoms.  Also has a hx of gout, and states symptoms are similar.  Localizes the pain to the outside of knee.  Describes the pain as intermittent and sharp in character.  Has tried OTC medications without relief.  Symptoms are made worse with flexion.  Complains of redness and swelling and warmth.  Denies fever, chills, ecchymosis, weakness, numbness and tingling..   Also complains of bilateral ear pressure and vertigo x 3 days.  Vertigo described as the room spinning for a few seconds with position changes.  Has tried meclizine without relief. Complains of associated tinnitus.  Denies headache, vision changes, facial droop, slurred speech, weakness in arms or legs.    ROS: As per HPI.  All other pertinent ROS negative.     Past Medical History:  Diagnosis Date  . GERD (gastroesophageal reflux disease)   . Gout   . High cholesterol   . Obese    Past Surgical History:  Procedure Laterality Date  . ANAL FISTULOTOMY    . HERNIA REPAIR     Allergies  Allergen Reactions  . Morphine And Related Other (See Comments)    Aggressive, out of mind    No current facility-administered medications on file prior to encounter.   Current Outpatient Medications on File Prior to Encounter  Medication Sig Dispense Refill  . fenofibrate (TRICOR) 145 MG tablet Take 145 mg by mouth daily.    . Multiple Vitamin (MULTIVITAMIN) capsule Take 1 capsule by mouth daily.    Marland Kitchen ibuprofen (ADVIL,MOTRIN) 200 MG tablet Take 400 mg by mouth every 6 (six) hours as needed for moderate pain.     Marland Kitchen omeprazole (PRILOSEC) 20 MG capsule Take 20 mg by mouth 2 (two) times daily before a meal. OTC medication    .  [DISCONTINUED] gemfibrozil (LOPID) 600 MG tablet Take 600 mg by mouth 2 (two) times daily before a meal.     Social History   Socioeconomic History  . Marital status: Married    Spouse name: Not on file  . Number of children: Not on file  . Years of education: Not on file  . Highest education level: Not on file  Occupational History  . Not on file  Tobacco Use  . Smoking status: Former Research scientist (life sciences)  . Smokeless tobacco: Former Network engineer and Sexual Activity  . Alcohol use: Yes    Comment: occ  . Drug use: No  . Sexual activity: Not on file  Other Topics Concern  . Not on file  Social History Narrative  . Not on file   Social Determinants of Health   Financial Resource Strain:   . Difficulty of Paying Living Expenses: Not on file  Food Insecurity:   . Worried About Charity fundraiser in the Last Year: Not on file  . Ran Out of Food in the Last Year: Not on file  Transportation Needs:   . Lack of Transportation (Medical): Not on file  . Lack of Transportation (Non-Medical): Not on file  Physical Activity:   . Days of Exercise per Week: Not on file  . Minutes of Exercise per Session: Not on file  Stress:   . Feeling  of Stress : Not on file  Social Connections:   . Frequency of Communication with Friends and Family: Not on file  . Frequency of Social Gatherings with Friends and Family: Not on file  . Attends Religious Services: Not on file  . Active Member of Clubs or Organizations: Not on file  . Attends Banker Meetings: Not on file  . Marital Status: Not on file  Intimate Partner Violence:   . Fear of Current or Ex-Partner: Not on file  . Emotionally Abused: Not on file  . Physically Abused: Not on file  . Sexually Abused: Not on file   Family History  Problem Relation Age of Onset  . Healthy Mother   . Healthy Father     OBJECTIVE:  Vitals:   07/26/19 1340  BP: (!) 143/88  Pulse: 67  Resp: 16  Temp: 98.7 F (37.1 C)  TempSrc: Oral    SpO2: 97%    General appearance: ALERT; in no acute distress.  HENT: NCAT; PERRL, EOMI grossly; nares patent without rhinorrhea; oropharynx clear Lungs: Normal respiratory effort; CTAB CV: RRR Musculoskeletal: RT knee Inspection: mild erythema Palpation: Mildly TTP over lateral joint line ROM: LROM Strength: 5/5 knee flexion, 5/5 knee extension, 5/5 dorsiflexion, 5/5 plantar flexion Stability: Anterior/ posterior drawer intact Skin: warm and dry Neurologic: Ambulates with minimal difficulty; Sensation intact about the lower extremities Psychological: alert and cooperative; normal mood and affect  ASSESSMENT & PLAN:  1. Acute pain of right knee   2. Acute gout of right knee, unspecified cause   3. Benign paroxysmal positional vertigo, unspecified laterality    Meds ordered this encounter  Medications  . dexamethasone (DECADRON) injection 10 mg   RT knee pain: Continue conservative management of rest, ice, and elevation Decadron shot given in office Will call in few days for prednisone rx if steroid shot does not work Follow up with PCP if symptoms persist Return or go to the ER if you have any new or worsening symptoms (fever, chills, chest pain, increased swelling, redness, etc...)   Vertigo: Steroid shot given Continue with meclizine as prescribed Follow up with PCP if symptoms persists Return or go to the ER if you have any new or worsening symptoms headache, vision changes, slurred speech, facial droop, weakness in arms or legs, etc...  Reviewed expectations re: course of current medical issues. Questions answered. Outlined signs and symptoms indicating need for more acute intervention. Patient verbalized understanding. After Visit Summary given.    Rennis Harding, PA-C 07/26/19 1636

## 2019-07-26 NOTE — Discharge Instructions (Signed)
RT knee pain: Continue conservative management of rest, ice, and elevation Decadron shot given in office Will call in few days for prednisone rx if steroid shot does not work Follow up with PCP if symptoms persist Return or go to the ER if you have any new or worsening symptoms (fever, chills, chest pain, increased swelling, redness, etc...)   Vertigo: Steroid shot given Continue with meclizine as prescribed Follow up with PCP if symptoms persists Return or go to the ER if you have any new or worsening symptoms headache, vision changes, slurred speech, facial droop, weakness in arms or legs, etc..Marland Kitchen

## 2019-07-26 NOTE — ED Triage Notes (Signed)
Pt presents to UC w/ c/o right knee pain since yesterday night. Pt states he injured it 2 months ago by putting all of his weight on it.  Hx gout. Ice helps pain in right knee.   Pt also feels pressure in ears, causing increased vertigo.

## 2019-07-28 ENCOUNTER — Ambulatory Visit
Admission: EM | Admit: 2019-07-28 | Discharge: 2019-07-28 | Disposition: A | Discharge status: Home / Self Care | Payer: No Typology Code available for payment source | Attending: Emergency Medicine | Admitting: Emergency Medicine

## 2019-07-28 ENCOUNTER — Other Ambulatory Visit: Payer: Self-pay

## 2019-07-28 DIAGNOSIS — M25561 Pain in right knee: Secondary | ICD-10-CM

## 2019-07-28 DIAGNOSIS — M109 Gout, unspecified: Secondary | ICD-10-CM

## 2019-07-28 MED ORDER — COLCHICINE 0.6 MG PO TABS: ORAL_TABLET | ORAL | 0 refills | Status: DC

## 2019-07-28 MED ORDER — DEXAMETHASONE SODIUM PHOSPHATE 10 MG/ML IJ SOLN
10.0000 mg | Freq: Once | INTRAMUSCULAR | Status: AC
  Administered 2019-07-28: 10 mg via INTRAMUSCULAR

## 2019-07-28 NOTE — ED Triage Notes (Signed)
Pt presents with complaints of continued right knee pain. Reports he was seen here the other day for the same. Reports steroids helped previously. Denies any new injury. Reports injury 2 months ago.

## 2019-07-28 NOTE — Discharge Instructions (Signed)
Steroid shot given in office Prescribed colchicine take as directed and to completion Follow up with PCP for further evaluation and management Return or go to the ER if you have any new or worsening symptoms (fever, chills, chest pain, abdominal pain, changes in bowel or bladder habits, pain radiating into lower legs, etc...)

## 2019-07-28 NOTE — ED Provider Notes (Signed)
[place]   [id number] 07/28/19 Arrival Time: 9509  CC: RT knee pain  SUBJECTIVE: History from: patient. Marc Black is a 44 y.o. male complains of RT knee pain x 1 day.  Denies a precipitating event or specific injury.  Localizes the pain to the RT knee.  Describes the pain as intermittent and achy in character.  Was seen for similar symptoms on 07/26/19 and treated with decadron shot.  Reports temporary relief with steroid shot.  However, requests colchicine.  Symptoms are made worse with walking, flexion, and extension.  Denies fever, chills, ecchymosis, numbness and tingling.      ROS: As per HPI.  All other pertinent ROS negative.     Past Medical History:  Diagnosis Date  . GERD (gastroesophageal reflux disease)   . Gout   . High cholesterol   . Obese    Past Surgical History:  Procedure Laterality Date  . ANAL FISTULOTOMY    . HERNIA REPAIR     Allergies  Allergen Reactions  . Morphine And Related Other (See Comments)    Aggressive, out of mind    No current facility-administered medications on file prior to encounter.   Current Outpatient Medications on File Prior to Encounter  Medication Sig Dispense Refill  . fenofibrate (TRICOR) 145 MG tablet Take 145 mg by mouth daily.    Marland Kitchen ibuprofen (ADVIL,MOTRIN) 200 MG tablet Take 400 mg by mouth every 6 (six) hours as needed for moderate pain.     . Multiple Vitamin (MULTIVITAMIN) capsule Take 1 capsule by mouth daily.    Marland Kitchen omeprazole (PRILOSEC) 20 MG capsule Take 20 mg by mouth 2 (two) times daily before a meal. OTC medication    . [DISCONTINUED] gemfibrozil (LOPID) 600 MG tablet Take 600 mg by mouth 2 (two) times daily before a meal.     Social History   Socioeconomic History  . Marital status: Married    Spouse name: Not on file  . Number of children: Not on file  . Years of education: Not on file  . Highest education level: Not on file  Occupational History  . Not on file  Tobacco Use  . Smoking  status: Former Research scientist (life sciences)  . Smokeless tobacco: Former Network engineer and Sexual Activity  . Alcohol use: Yes    Comment: occ  . Drug use: No  . Sexual activity: Not on file  Other Topics Concern  . Not on file  Social History Narrative  . Not on file   Social Determinants of Health   Financial Resource Strain:   . Difficulty of Paying Living Expenses: Not on file  Food Insecurity:   . Worried About Charity fundraiser in the Last Year: Not on file  . Ran Out of Food in the Last Year: Not on file  Transportation Needs:   . Lack of Transportation (Medical): Not on file  . Lack of Transportation (Non-Medical): Not on file  Physical Activity:   . Days of Exercise per Week: Not on file  . Minutes of Exercise per Session: Not on file  Stress:   . Feeling of Stress : Not on file  Social Connections:   . Frequency of Communication with Friends and Family: Not on file  . Frequency of Social Gatherings with Friends and Family: Not on file  . Attends Religious Services: Not on file  . Active Member of Clubs or Organizations: Not on file  . Attends Archivist Meetings: Not on file  .  Marital Status: Not on file  Intimate Partner Violence:   . Fear of Current or Ex-Partner: Not on file  . Emotionally Abused: Not on file  . Physically Abused: Not on file  . Sexually Abused: Not on file   Family History  Problem Relation Age of Onset  . Healthy Mother   . Healthy Father     OBJECTIVE:  Vitals:   07/28/19 1501  BP: (!) 146/76  Pulse: 76  Resp: 17  Temp: 98 F (36.7 C)  TempSrc: Oral  SpO2: 98%    General appearance: ALERT; in no acute distress.  Head: NCAT Lungs: Normal respiratory effort Musculoskeletal: RT knee  Inspection: Erythematous with swelling Palpation: diffusely TTP over anterior knee ROM: LROM Strength: 5/5 knee flexion, 5/5 knee extension Skin: warm and dry Neurologic: Ambulates with minimal difficulty; Sensation intact about the lower  extremities Psychological: alert and cooperative; normal mood and affect   ASSESSMENT & PLAN:  1. Acute pain of right knee   2. Acute gout of right knee, unspecified cause     Meds ordered this encounter  Medications  . colchicine 0.6 MG tablet    Sig: 1.2 mg (two 0.6-mg tablets) orally at the first sign of a flare followed by 0.6 mg (1 tablet) one hour later; MAX 1.8 mg over 1 hour.    Dispense:  10 tablet    Refill:  0    Order Specific Question:   Supervising Provider    Answer:   Eustace Moore [[id number]]  . dexamethasone (DECADRON) injection 10 mg   Steroid shot given in office Prescribed colchicine take as directed and to completion Follow up with PCP for further evaluation and management Return or go to the ER if you have any new or worsening symptoms (fever, chills, chest pain, abdominal pain, changes in bowel or bladder habits, pain radiating into lower legs, etc...)    Reviewed expectations re: course of current medical issues. Questions answered. Outlined signs and symptoms indicating need for more acute intervention. Patient verbalized understanding. After Visit Summary given.    Rennis Harding, PA-C 07/28/19 1606

## 2019-07-30 MED FILL — KETOROLAC 10 MG TABLET: 10 | 5 days supply | Qty: 15 | Fill #0

## 2019-07-30 MED FILL — COLCHICINE 0.6 MG CAPSULE: 0.6 | 9 days supply | Qty: 10 | Fill #0

## 2019-09-06 MED FILL — FENOFIBRATE 160 MG TABLET: 160 | 30 days supply | Qty: 30 | Fill #3

## 2019-09-09 MED FILL — VASCEPA 1 GM CAPSULE: 1 | 30 days supply | Qty: 120 | Fill #0

## 2019-09-27 MED FILL — AMOX-CLAV 875-125 MG TABLET: 875-125 | 10 days supply | Qty: 20 | Fill #0

## 2019-10-01 MED FILL — METHYLPREDNISOLONE 4 MG TAB: 4 | 6 days supply | Qty: 21 | Fill #0

## 2019-10-01 MED FILL — ALBUTEROL SULFATE HFA 108 (: 108 (90 BAS | 17 days supply | Qty: 9 | Fill #0

## 2019-10-11 MED FILL — FENOFIBRATE 160 MG TABLET: 160 | 30 days supply | Qty: 30 | Fill #4

## 2019-10-21 ENCOUNTER — Ambulatory Visit
Admission: EM | Admit: 2019-10-21 | Discharge: 2019-10-21 | Disposition: A | Discharge status: Home / Self Care | Payer: No Typology Code available for payment source

## 2019-10-21 ENCOUNTER — Other Ambulatory Visit: Payer: Self-pay

## 2019-10-21 ENCOUNTER — Ambulatory Visit (INDEPENDENT_AMBULATORY_CARE_PROVIDER_SITE_OTHER): Payer: No Typology Code available for payment source

## 2019-10-21 DIAGNOSIS — R059 Cough, unspecified: Secondary | ICD-10-CM

## 2019-10-21 DIAGNOSIS — R0602 Shortness of breath: Secondary | ICD-10-CM | POA: Diagnosis not present

## 2019-10-21 DIAGNOSIS — R05 Cough: Secondary | ICD-10-CM

## 2019-10-21 MED ORDER — BENZONATATE 100 MG PO CAPS: 100.0000 mg | ORAL_CAPSULE | Freq: Three times a day (TID) | ORAL | 0 refills | Status: DC

## 2019-10-21 MED ORDER — PREDNISONE 10 MG (21) PO TBPK: ORAL_TABLET | ORAL | 0 refills | Status: DC

## 2019-10-21 MED FILL — BENZONATATE 100 MG CAPS: 100 | 10 days supply | Qty: 30 | Fill #0

## 2019-10-21 MED FILL — predniSONE 10 MG TABS: 10 | 12 days supply | Qty: 42 | Fill #0

## 2019-10-21 NOTE — ED Provider Notes (Signed)
[facility]    CSN: [id number] Arrival date & time: 10/21/19  2633      History   Chief Complaint Chief Complaint  Patient presents with  . Shortness of Breath  . Cough    HPI Marc Black is a 44 y.o. male.   Who presented to the urgent care with a complaint of worsening shortness of breath and cough for the past 2 weeks.  Report he tested negative for COVID-19 2 weeks ago.  Has completed course of Augmentin without relief of symptoms.  He is also using albuterol without relief.  Denies exposure to Covid, flu or strep.  Denies any precipitating or alleviating factors.  Denies chills, fever, nausea, vomiting, chest pain, chest tightness.  The history is provided by the patient. No language interpreter was used.  Shortness of Breath Associated symptoms: cough   Cough Associated symptoms: shortness of breath     Past Medical History:  Diagnosis Date  . GERD (gastroesophageal reflux disease)   . Gout   . High cholesterol   . Obese     There are no problems to display for this patient.   Past Surgical History:  Procedure Laterality Date  . ANAL FISTULOTOMY    . HERNIA REPAIR         Home Medications    Prior to Admission medications   Medication Sig Start Date End Date Taking? Authorizing Provider  cetirizine (ZYRTEC) 10 MG chewable tablet Chew 10 mg by mouth daily.   Yes [provider]  benzonatate (TESSALON) 100 MG capsule Take 1 capsule (100 mg total) by mouth every 8 (eight) hours. 10/21/19   Pennye Beeghly, Darrelyn Hillock, FNP  colchicine 0.6 MG tablet 1.2 mg (two 0.6-mg tablets) orally at the first sign of a flare followed by 0.6 mg (1 tablet) one hour later; MAX 1.8 mg over 1 hour. 07/28/19   Wurst, [place], PA-C  fenofibrate (TRICOR) 145 MG tablet Take 145 mg by mouth daily.    [provider]  ibuprofen (ADVIL,MOTRIN) 200 MG tablet Take 400 mg by mouth every 6 (six) hours as needed for moderate pain.     [provider]    Multiple Vitamin (MULTIVITAMIN) capsule Take 1 capsule by mouth daily.    [provider]  omeprazole (PRILOSEC) 20 MG capsule Take 20 mg by mouth 2 (two) times daily before a meal. OTC medication    [provider]  predniSONE (STERAPRED UNI-PAK 21 TAB) 10 MG (21) TBPK tablet Take 6 tabs by mouth daily  for 2 days, then 5 tabs for 2 days, then 4 tabs for 2 days, then 3 tabs for 2 days, 2 tabs for 2 days, then 1 tab by mouth daily for 2 days 10/21/19   Emerson Monte, FNP  gemfibrozil (LOPID) 600 MG tablet Take 600 mg by mouth 2 (two) times daily before a meal.  07/26/19  [provider]    Family History Family History  Problem Relation Age of Onset  . Healthy Mother   . Healthy Father     Social History Social History   Tobacco Use  . Smoking status: Former Research scientist (life sciences)  . Smokeless tobacco: Former Network engineer Use Topics  . Alcohol use: Yes    Comment: occ  . Drug use: No     Allergies   Morphine and related and Levaquin [levofloxacin]   Review of Systems Review of Systems  Constitutional: Negative.   HENT: Negative.   Respiratory: Positive for cough and  shortness of breath.   Cardiovascular: Negative.   Gastrointestinal: Negative.   Neurological: Negative.   All other systems reviewed and are negative.    Physical Exam Triage Vital Signs ED Triage Vitals  Enc Vitals Group     BP 10/21/19 0827 132/89     Pulse Rate 10/21/19 0827 71     Resp 10/21/19 0827 16     Temp 10/21/19 0827 98.1 F (36.7 C)     Temp src --      SpO2 10/21/19 0827 98 %     Weight --      Height --      Head Circumference --      Peak Flow --      Pain Score 10/21/19 0828 0     Pain Loc --      Pain Edu? --      Excl. in GC? --    No data found.  Updated Vital Signs BP 132/89   Pulse 71   Temp 98.1 F (36.7 C)   Resp 16   SpO2 98%   Visual Acuity Right Eye Distance:   Left Eye Distance:   Bilateral Distance:    Right Eye Near:   Left Eye  Near:    Bilateral Near:     Physical Exam Vitals and nursing note reviewed.  Constitutional:      General: He is not in acute distress.    Appearance: Normal appearance. He is normal weight. He is not ill-appearing or toxic-appearing.  HENT:     Head: Normocephalic.     Right Ear: Tympanic membrane, ear canal and external ear normal. There is no impacted cerumen.     Left Ear: Tympanic membrane, ear canal and external ear normal. There is no impacted cerumen.     Nose: Nose normal. No congestion.     Mouth/Throat:     Mouth: Mucous membranes are moist.     Pharynx: Oropharynx is clear. No oropharyngeal exudate or posterior oropharyngeal erythema.  Cardiovascular:     Rate and Rhythm: Normal rate and regular rhythm.     Pulses: Normal pulses.     Heart sounds: Normal heart sounds. No murmur. No friction rub.  Pulmonary:     Effort: Pulmonary effort is normal. No respiratory distress.     Breath sounds: Normal breath sounds. No stridor. No wheezing, rhonchi or rales.  Chest:     Chest wall: No tenderness.  Abdominal:     General: Abdomen is flat. Bowel sounds are normal. There is no distension.     Palpations: Abdomen is soft. There is no mass.     Tenderness: There is no abdominal tenderness.     Hernia: No hernia is present.  Skin:    Capillary Refill: Capillary refill takes less than 2 seconds.  Neurological:     General: No focal deficit present.     Mental Status: He is alert and oriented to person, place, and time.      UC Treatments / Results  Labs (all labs ordered are listed, but only abnormal results are displayed) Labs Reviewed - No data to display  EKG   Radiology DG Chest 2 View  Result Date: 10/21/2019 CLINICAL DATA:  Cough, shortness of breath. EXAM: CHEST - 2 VIEW COMPARISON:  August 21, 2013. FINDINGS: The heart size and mediastinal contours are within normal limits. Both lungs are clear. No pneumothorax or pleural effusion is noted. The visualized  skeletal structures are unremarkable. IMPRESSION: No active  cardiopulmonary disease. Electronically Signed   By: Lupita Raider M.D.   On: 10/21/2019 09:13    Procedures Procedures (including critical care time)  Medications Ordered in UC Medications - No data to display  Initial Impression / Assessment and Plan / UC Course  I have reviewed the triage vital signs and the nursing notes.  Pertinent labs & imaging results that were available during my care of the patient were reviewed by me and considered in my medical decision making (see chart for details).    Patient is stable at discharge.  Chest x-ray is negative for acute cardiopulmonary disease.  I have reviewed the x-ray myself and the radiologist interpretation.  I am in agreement with the radiologist interpretation. Tessalon Perles prescribed for cough Prednisone will be prescribed.  Was advised to continue to take albuterol, Mucinex, Zyrtec and Flonase as prescribed.  Final Clinical Impressions(s) / UC Diagnoses   Final diagnoses:  Shortness of breath  Cough     Discharge Instructions     Your chest x-ray was negative. Tessalon Perles prescribed for cough Prednisone taper was prescribed Continue to take Mucinex, Flonase and Zyrtec as prescribed Continue to use albuterol as prescribed Follow-up with PCP Return or go to ED for worsening symptoms    ED Prescriptions    Medication Sig Dispense Auth. Provider   benzonatate (TESSALON) 100 MG capsule Take 1 capsule (100 mg total) by mouth every 8 (eight) hours. 30 capsule Johnpatrick Jenny S, FNP   predniSONE (STERAPRED UNI-PAK 21 TAB) 10 MG (21) TBPK tablet Take 6 tabs by mouth daily  for 2 days, then 5 tabs for 2 days, then 4 tabs for 2 days, then 3 tabs for 2 days, 2 tabs for 2 days, then 1 tab by mouth daily for 2 days 42 tablet Dimitriy Carreras, Zachery Dakins, FNP     PDMP not reviewed this encounter.   Durward Parcel, FNP 10/21/19 [phone]

## 2019-10-21 NOTE — ED Triage Notes (Signed)
Pt c/o cough and shortness of breath for 2 weeks. Pt tested negative for COVID 2 weeks ago, completed 1 round of antibiotics by PCP without improvement.

## 2019-10-21 NOTE — Discharge Instructions (Addendum)
Your chest x-ray was negative. Tessalon Perles prescribed for cough Prednisone taper was prescribed Continue to take Mucinex, Flonase and Zyrtec as prescribed Continue to use albuterol as prescribed Follow-up with PCP Return or go to ED for worsening symptoms

## 2019-11-27 MED FILL — FENOFIBRATE 160 MG TABLET: 160 | 30 days supply | Qty: 30 | Fill #5

## 2020-01-07 ENCOUNTER — Other Ambulatory Visit (HOSPITAL_COMMUNITY): Payer: Self-pay | Admitting: Adult Health Nurse Practitioner

## 2020-01-08 MED FILL — FENOFIBRATE 160 MG TABLET: 160 | 30 days supply | Qty: 30 | Fill #0

## 2020-02-20 MED FILL — FENOFIBRATE 160 MG TABLET: 160 | 30 days supply | Qty: 30 | Fill #1

## 2020-03-25 MED FILL — FENOFIBRATE 160 MG TABLET: 160 | 30 days supply | Qty: 30 | Fill #2

## 2020-07-01 MED FILL — FENOFIBRATE 160 MG TABLET: 160 | 30 days supply | Qty: 30 | Fill #3

## 2020-08-05 MED FILL — FENOFIBRATE 160 MG TABLET: 160 | 30 days supply | Qty: 30 | Fill #4

## 2020-11-21 ENCOUNTER — Other Ambulatory Visit (HOSPITAL_COMMUNITY): Payer: Self-pay

## 2020-11-22 ENCOUNTER — Telehealth: Payer: No Typology Code available for payment source | Admitting: Family

## 2020-11-22 DIAGNOSIS — J019 Acute sinusitis, unspecified: Secondary | ICD-10-CM

## 2020-11-22 MED ORDER — AMOXICILLIN-POT CLAVULANATE 875-125 MG PO TABS
1.0000 | ORAL_TABLET | Freq: Two times a day (BID) | ORAL | 0 refills | Status: DC
  Filled 2020-11-22: qty 14, 7d supply, fill #0

## 2020-11-22 NOTE — Progress Notes (Signed)

## 2020-11-23 ENCOUNTER — Other Ambulatory Visit (HOSPITAL_COMMUNITY): Payer: Self-pay

## 2020-11-25 ENCOUNTER — Other Ambulatory Visit (HOSPITAL_COMMUNITY): Payer: Self-pay

## 2020-11-26 ENCOUNTER — Other Ambulatory Visit (HOSPITAL_COMMUNITY): Payer: Self-pay

## 2020-12-01 ENCOUNTER — Other Ambulatory Visit (HOSPITAL_COMMUNITY): Payer: Self-pay

## 2020-12-09 ENCOUNTER — Other Ambulatory Visit (HOSPITAL_COMMUNITY): Payer: Self-pay

## 2020-12-15 ENCOUNTER — Other Ambulatory Visit (HOSPITAL_COMMUNITY): Payer: Self-pay

## 2020-12-15 MED ORDER — FENOFIBRATE 160 MG PO TABS
160.0000 mg | ORAL_TABLET | Freq: Every day | ORAL | 1 refills | Status: DC
  Filled 2020-12-15: qty 90, 90d supply, fill #0
  Filled 2021-03-16: qty 90, 90d supply, fill #1

## 2020-12-15 MED ORDER — ICOSAPENT ETHYL 1 G PO CAPS
2.0000 g | ORAL_CAPSULE | Freq: Two times a day (BID) | ORAL | 0 refills | Status: DC
  Filled 2020-12-15: qty 180, 45d supply, fill #0

## 2020-12-23 ENCOUNTER — Encounter: Payer: Self-pay | Admitting: Emergency Medicine

## 2020-12-23 ENCOUNTER — Other Ambulatory Visit: Payer: Self-pay

## 2020-12-23 ENCOUNTER — Ambulatory Visit
Admission: EM | Admit: 2020-12-23 | Discharge: 2020-12-23 | Disposition: A | Discharge status: Home / Self Care | Payer: No Typology Code available for payment source | Attending: Family Medicine | Admitting: Family Medicine

## 2020-12-23 DIAGNOSIS — Z113 Encounter for screening for infections with a predominantly sexual mode of transmission: Secondary | ICD-10-CM | POA: Insufficient documentation

## 2020-12-23 DIAGNOSIS — Z202 Contact with and (suspected) exposure to infections with a predominantly sexual mode of transmission: Secondary | ICD-10-CM | POA: Insufficient documentation

## 2020-12-23 NOTE — ED Provider Notes (Signed)
  [facility]   [id number] 12/23/20 Arrival Time: 1325  ASSESSMENT & PLAN:  1. Screening for STDs (sexually transmitted diseases)   2. Exposure to STD      Discharge Instructions      We have sent testing for sexually transmitted infections. We will notify you of any positive results once they are received. If required, we will prescribe any medications you might need.  Please refrain from all sexual activity for at least the next seven days.     Pending: Labs Reviewed  RPR  HIV ANTIBODY (ROUTINE TESTING W REFLEX)  CYTOLOGY, (ORAL, ANAL, URETHRAL) ANCILLARY ONLY   Will notify of any positive results. Instructed to refrain from sexual activity for at least seven days.  Reviewed expectations re: course of current medical issues. Questions answered. Outlined signs and symptoms indicating need for more acute intervention. Patient verbalized understanding. After Visit Summary given.   SUBJECTIVE:  Marc Black is a 45 y.o. male who requests STD testing. No symptoms. New partner. Afebrile. No abdominal or pelvic pain. No n/v. No rashes or lesions.  OBJECTIVE:  Vitals:   12/23/20 1336 12/23/20 1400  BP: (!) 157/84   Pulse: (!) 2 72  Resp: 16   Temp: 98.5 F (36.9 C)   TempSrc: Oral   SpO2: 96%      General appearance: alert, cooperative, appears stated age and no distress GU: deferred Skin: warm and dry Psychological: alert and cooperative; normal mood and affect.    Labs Reviewed  RPR  HIV ANTIBODY (ROUTINE TESTING W REFLEX)  CYTOLOGY, (ORAL, ANAL, URETHRAL) ANCILLARY ONLY    Allergies  Allergen Reactions   Morphine And Related Other (See Comments)    Aggressive, out of mind    Levaquin [Levofloxacin] Rash    Past Medical History:  Diagnosis Date   GERD (gastroesophageal reflux disease)    Gout    High cholesterol    Obese    Family History  Problem Relation Age of Onset   Healthy Mother    Healthy Father    Social History    Socioeconomic History   Marital status: Married    Spouse name: Not on file   Number of children: Not on file   Years of education: Not on file   Highest education level: Not on file  Occupational History   Not on file  Tobacco Use   Smoking status: Former   Smokeless tobacco: Former  Substance and Sexual Activity   Alcohol use: Yes    Comment: occ   Drug use: No   Sexual activity: Not on file  Other Topics Concern   Not on file  Social History Narrative   Not on file   Social Determinants of Health   Financial Resource Strain: Not on file  Food Insecurity: Not on file  Transportation Needs: Not on file  Physical Activity: Not on file  Stress: Not on file  Social Connections: Not on file  Intimate Partner Violence: Not on file           [place], MD 12/23/20 1423

## 2020-12-23 NOTE — ED Triage Notes (Signed)
Wants an STD test done with lab work.

## 2020-12-23 NOTE — Discharge Instructions (Addendum)
We have sent testing for sexually transmitted infections. We will notify you of any positive results once they are received. If required, we will prescribe any medications you might need.  Please refrain from all sexual activity for at least the next seven days.  

## 2020-12-24 LAB — RPR: RPR Ser Ql: NONREACTIVE

## 2020-12-24 LAB — CYTOLOGY, (ORAL, ANAL, URETHRAL) ANCILLARY ONLY
Chlamydia: NEGATIVE
Comment: NEGATIVE
Comment: NEGATIVE
Comment: NORMAL
Neisseria Gonorrhea: NEGATIVE
Trichomonas: NEGATIVE

## 2020-12-24 LAB — HIV ANTIBODY (ROUTINE TESTING W REFLEX): HIV Screen 4th Generation wRfx: NONREACTIVE

## 2021-02-11 ENCOUNTER — Other Ambulatory Visit (HOSPITAL_COMMUNITY): Payer: Self-pay | Admitting: Internal Medicine

## 2021-02-11 ENCOUNTER — Other Ambulatory Visit: Payer: Self-pay | Admitting: Internal Medicine

## 2021-03-16 ENCOUNTER — Other Ambulatory Visit (HOSPITAL_COMMUNITY): Payer: Self-pay

## 2021-06-15 ENCOUNTER — Other Ambulatory Visit (HOSPITAL_COMMUNITY): Payer: Self-pay

## 2021-06-16 ENCOUNTER — Other Ambulatory Visit (HOSPITAL_COMMUNITY): Payer: Self-pay

## 2021-06-18 ENCOUNTER — Other Ambulatory Visit (HOSPITAL_COMMUNITY): Payer: Self-pay

## 2021-06-18 MED ORDER — FENOFIBRATE 160 MG PO TABS
160.0000 mg | ORAL_TABLET | Freq: Every day | ORAL | 0 refills | Status: DC
  Filled 2021-06-18: qty 90, 90d supply, fill #0

## 2021-07-12 ENCOUNTER — Other Ambulatory Visit (HOSPITAL_COMMUNITY): Payer: Self-pay

## 2021-07-12 MED ORDER — OZEMPIC (0.25 OR 0.5 MG/DOSE) 2 MG/1.5ML ~~LOC~~ SOPN
0.2500 mg | PEN_INJECTOR | SUBCUTANEOUS | 2 refills | Status: DC
  Filled 2021-07-12 – 2021-07-14 (×2): qty 1.5, 56d supply, fill #0

## 2021-07-12 MED ORDER — OZEMPIC (0.25 OR 0.5 MG/DOSE) 2 MG/1.5ML ~~LOC~~ SOPN
0.2500 mg | PEN_INJECTOR | SUBCUTANEOUS | 2 refills | Status: DC
  Filled 2021-07-12: qty 1.5, 28d supply, fill #0
  Filled 2021-07-20: qty 1.5, 56d supply, fill #0
  Filled 2021-08-23 – 2021-09-01 (×2): qty 1.5, 56d supply, fill #1

## 2021-07-13 ENCOUNTER — Other Ambulatory Visit (HOSPITAL_COMMUNITY): Payer: Self-pay

## 2021-07-14 ENCOUNTER — Other Ambulatory Visit (HOSPITAL_COMMUNITY): Payer: Self-pay

## 2021-07-16 ENCOUNTER — Encounter: Payer: Self-pay | Admitting: Internal Medicine

## 2021-07-20 ENCOUNTER — Other Ambulatory Visit (HOSPITAL_COMMUNITY): Payer: Self-pay

## 2021-08-16 ENCOUNTER — Other Ambulatory Visit (HOSPITAL_COMMUNITY): Payer: Self-pay

## 2021-08-16 MED ORDER — OZEMPIC (0.25 OR 0.5 MG/DOSE) 2 MG/1.5ML ~~LOC~~ SOPN
0.2500 mg | PEN_INJECTOR | SUBCUTANEOUS | 2 refills | Status: DC
  Filled 2021-08-16 – 2021-08-24 (×2): qty 1.5, 56d supply, fill #0

## 2021-08-16 MED ORDER — FENOFIBRATE 160 MG PO TABS
160.0000 mg | ORAL_TABLET | Freq: Every day | ORAL | 0 refills | Status: DC
  Filled 2021-08-16 – 2021-08-20 (×2): qty 90, 90d supply, fill #0

## 2021-08-20 ENCOUNTER — Other Ambulatory Visit (HOSPITAL_COMMUNITY): Payer: Self-pay

## 2021-08-23 ENCOUNTER — Other Ambulatory Visit (HOSPITAL_COMMUNITY): Payer: Self-pay

## 2021-08-24 ENCOUNTER — Other Ambulatory Visit (HOSPITAL_COMMUNITY): Payer: Self-pay

## 2021-09-01 ENCOUNTER — Other Ambulatory Visit (HOSPITAL_COMMUNITY): Payer: Self-pay

## 2021-09-02 ENCOUNTER — Other Ambulatory Visit (HOSPITAL_COMMUNITY): Payer: Self-pay

## 2021-09-15 ENCOUNTER — Ambulatory Visit: Payer: Self-pay

## 2021-09-23 ENCOUNTER — Ambulatory Visit (INDEPENDENT_AMBULATORY_CARE_PROVIDER_SITE_OTHER): Payer: Self-pay | Admitting: *Deleted

## 2021-09-23 VITALS — Ht 72.0 in | Wt 244.0 lb

## 2021-09-23 DIAGNOSIS — Z1211 Encounter for screening for malignant neoplasm of colon: Secondary | ICD-10-CM

## 2021-09-23 NOTE — Progress Notes (Signed)
Gastroenterology Pre-Procedure Review ? ?Request Date: 09/23/2021 ?Requesting Physician: Leone Payor, FNP-C, no previous TCS ? ?PATIENT REVIEW QUESTIONS: The patient responded to the following health history questions as indicated:   ? ?1. Diabetes Melitis: yes, type II ?2. Joint replacements in the past 12 months: no ?3. Major health problems in the past 3 months: no ?4. Has an artificial valve or MVP: no ?5. Has a defibrillator: no ?6. Has been advised in past to take antibiotics in advance of a procedure like teeth cleaning: no ?7. Family history of colon cancer: no, but is unsure  ?8. Alcohol Use: yes, 4 or 5 shots on Saturday nights ?9. Illicit drug Use: no ?10. History of sleep apnea: yes, but doesn't use CPAP ?11. History of coronary artery or other vascular stents placed within the last 12 months: no ?12. History of any prior anesthesia complications: yes, aggressive after hernia surgery ?13. Body mass index is 33.09 kg/m?. ?   ?MEDICATIONS & ALLERGIES:    ?Patient reports the following regarding taking any blood thinners:   ?Plavix? no ?Aspirin? no ?Coumadin? no ?Brilinta? no ?Xarelto? no ?Eliquis? no ?Pradaxa? no ?Savaysa? no ?Effient? no ? ?Patient confirms/reports the following medications:  ?Current Outpatient Medications  ?Medication Sig Dispense Refill  ? fenofibrate 160 MG tablet Take 1 tablet (160 mg total) by mouth daily. (Patient taking differently: Take 160 mg by mouth at bedtime.) 90 tablet 0  ? ibuprofen (ADVIL,MOTRIN) 200 MG tablet Take 400 mg by mouth as needed for moderate pain.    ? Semaglutide,0.25 or 0.5MG /DOS, (OZEMPIC, 0.25 OR 0.5 MG/DOSE,) 2 MG/1.5ML SOPN Inject 0.25 mg into the skin once a week. 6 mL 2  ? ?No current facility-administered medications for this visit.  ? ? ?Patient confirms/reports the following allergies:  ?Allergies  ?Allergen Reactions  ? Morphine And Related Other (See Comments)  ?  Aggressive, out of mind   ? Levaquin [Levofloxacin] Rash  ? ? ?No orders of the  defined types were placed in this encounter. ? ? ?AUTHORIZATION INFORMATION ?Primary Insurance: [employer],  ID #: [id number],  Group #: [id number] ?Pre-Cert / Berkley Harvey required:  ?Pre-Cert / Auth #:  ? ?SCHEDULE INFORMATION: ?Procedure has been scheduled as follows:  ?Date: , Time:   ?Location: [facility] with Dr. Jena Gauss ? ?This Gastroenterology Pre-Precedure Review Form is being routed to the following provider(s): Tana Coast, PA-C ?  ?

## 2021-10-01 NOTE — Progress Notes (Signed)
Ok to schedule. ASA 2. ?Hold semaglutide one week before.  ?

## 2021-10-04 NOTE — Progress Notes (Signed)
Lmom for pt to call me back. 

## 2021-10-04 NOTE — Progress Notes (Signed)
Spoke to pt.  Offered to schedule for May.  Pt requested to schedule for June.  He informed me to avoid June 16-26. ?

## 2021-10-12 ENCOUNTER — Other Ambulatory Visit (HOSPITAL_COMMUNITY): Payer: Self-pay

## 2021-10-12 MED ORDER — OZEMPIC (0.25 OR 0.5 MG/DOSE) 2 MG/3ML ~~LOC~~ SOPN
0.5000 mg | PEN_INJECTOR | SUBCUTANEOUS | 2 refills | Status: DC
  Filled 2021-10-12: qty 9, 84d supply, fill #0

## 2021-10-15 NOTE — Progress Notes (Signed)
Requested August procedure.  Pt made aware that we will contact him once procedure schedules are available.  Pt apologized and said he just has a lot going on right now. ?

## 2021-10-27 ENCOUNTER — Other Ambulatory Visit (HOSPITAL_COMMUNITY): Payer: Self-pay

## 2021-10-27 MED ORDER — OZEMPIC (0.25 OR 0.5 MG/DOSE) 2 MG/3ML ~~LOC~~ SOPN
0.5000 mg | PEN_INJECTOR | SUBCUTANEOUS | 1 refills | Status: DC
  Filled 2021-10-27 – 2022-01-02 (×2): qty 9, 84d supply, fill #0
  Filled 2022-01-03: qty 3, 28d supply, fill #0
  Filled 2022-02-01: qty 3, 28d supply, fill #1
  Filled 2022-03-03: qty 3, 28d supply, fill #2
  Filled 2022-04-07: qty 3, 28d supply, fill #3
  Filled 2022-06-08: qty 3, 28d supply, fill #4
  Filled 2022-07-04: qty 3, 28d supply, fill #5

## 2021-10-27 MED ORDER — ROSUVASTATIN CALCIUM 5 MG PO TABS
5.0000 mg | ORAL_TABLET | Freq: Every day | ORAL | 1 refills | Status: DC
  Filled 2021-10-27 – 2021-11-04 (×2): qty 90, 90d supply, fill #0
  Filled 2022-02-01: qty 90, 90d supply, fill #1

## 2021-11-04 ENCOUNTER — Other Ambulatory Visit (HOSPITAL_COMMUNITY): Payer: Self-pay

## 2021-11-05 NOTE — Progress Notes (Addendum)
11/05/2021-Gave Marc Black triage paperwork to schedule in future.

## 2021-12-03 ENCOUNTER — Telehealth: Payer: Self-pay | Admitting: *Deleted

## 2021-12-03 ENCOUNTER — Other Ambulatory Visit (HOSPITAL_COMMUNITY): Payer: Self-pay

## 2021-12-03 NOTE — Telephone Encounter (Signed)
Called pt to schedule TCS with Dr. Jena Gauss, ASA 2 from triages. Reports he will call back once he knows his work schedule further

## 2021-12-13 ENCOUNTER — Other Ambulatory Visit (HOSPITAL_COMMUNITY): Payer: Self-pay

## 2021-12-13 MED ORDER — FENOFIBRATE 160 MG PO TABS
160.0000 mg | ORAL_TABLET | Freq: Every day | ORAL | 0 refills | Status: DC
  Filled 2021-12-13: qty 90, 90d supply, fill #0

## 2022-01-03 ENCOUNTER — Other Ambulatory Visit (HOSPITAL_COMMUNITY): Payer: Self-pay

## 2022-01-24 ENCOUNTER — Other Ambulatory Visit (HOSPITAL_COMMUNITY): Payer: Self-pay

## 2022-01-24 ENCOUNTER — Telehealth: Payer: No Typology Code available for payment source | Admitting: Physician Assistant

## 2022-01-24 DIAGNOSIS — J019 Acute sinusitis, unspecified: Secondary | ICD-10-CM | POA: Diagnosis not present

## 2022-01-24 DIAGNOSIS — B9689 Other specified bacterial agents as the cause of diseases classified elsewhere: Secondary | ICD-10-CM

## 2022-01-24 MED ORDER — DOXYCYCLINE HYCLATE 100 MG PO TABS
100.0000 mg | ORAL_TABLET | Freq: Two times a day (BID) | ORAL | 0 refills | Status: DC
Start: 2022-01-24 — End: 2023-02-27
  Filled 2022-01-24: qty 20, 10d supply, fill #0

## 2022-01-24 NOTE — Progress Notes (Signed)

## 2022-02-01 ENCOUNTER — Other Ambulatory Visit (HOSPITAL_COMMUNITY): Payer: Self-pay

## 2022-02-01 MED ORDER — FENOFIBRATE 160 MG PO TABS
160.0000 mg | ORAL_TABLET | Freq: Every day | ORAL | 0 refills | Status: DC
  Filled 2022-02-01 – 2022-03-14 (×2): qty 90, 90d supply, fill #0

## 2022-02-15 ENCOUNTER — Encounter: Payer: Self-pay | Admitting: Urology

## 2022-02-15 ENCOUNTER — Other Ambulatory Visit (HOSPITAL_COMMUNITY): Payer: Self-pay

## 2022-02-15 ENCOUNTER — Ambulatory Visit (INDEPENDENT_AMBULATORY_CARE_PROVIDER_SITE_OTHER): Payer: No Typology Code available for payment source | Admitting: Urology

## 2022-02-15 VITALS — BP 147/77 | HR 75

## 2022-02-15 DIAGNOSIS — N5312 Painful ejaculation: Secondary | ICD-10-CM | POA: Diagnosis not present

## 2022-02-15 DIAGNOSIS — N410 Acute prostatitis: Secondary | ICD-10-CM | POA: Diagnosis not present

## 2022-02-15 DIAGNOSIS — N486 Induration penis plastica: Secondary | ICD-10-CM

## 2022-02-15 LAB — URINALYSIS, ROUTINE W REFLEX MICROSCOPIC
Bilirubin, UA: NEGATIVE
Glucose, UA: NEGATIVE
Ketones, UA: NEGATIVE
Leukocytes,UA: NEGATIVE
Nitrite, UA: NEGATIVE
Protein,UA: NEGATIVE
RBC, UA: NEGATIVE
Specific Gravity, UA: 1.025 (ref 1.005–1.030)
Urobilinogen, Ur: 0.2 mg/dL (ref 0.2–1.0)
pH, UA: 5 (ref 5.0–7.5)

## 2022-02-15 MED ORDER — PENTOXIFYLLINE ER 400 MG PO TBCR
400.0000 mg | EXTENDED_RELEASE_TABLET | Freq: Two times a day (BID) | ORAL | 3 refills | Status: DC
  Filled 2022-02-15: qty 60, 30d supply, fill #0
  Filled 2022-03-14: qty 60, 30d supply, fill #1

## 2022-02-15 MED ORDER — SULFAMETHOXAZOLE-TRIMETHOPRIM 800-160 MG PO TABS
1.0000 | ORAL_TABLET | Freq: Two times a day (BID) | ORAL | 0 refills | Status: DC
  Filled 2022-02-15: qty 56, 28d supply, fill #0

## 2022-02-15 NOTE — Patient Instructions (Signed)

## 2022-02-15 NOTE — Progress Notes (Signed)
02/15/2022 10:55 AM   Marc Black Oct 14, 1975 [id number]  Referring provider: Kirstie Peri, MD [address] [place],  [place] [address]  Pain with ejaculation   HPI: Marc Black is a 46yo here for evaluation of pain with ejaculation and a penile nodule. 2 weeks ago he noted pain with an erection, penile nodule and pain with ejaculation. No penile curvature.  He is having new pelvic pain and suprapubic tenderness. He had an STD screen which was negative. No significant LUTS. No dysuria or hematuria. IPSS 5 QOL 1.  He is currently on doxycycline for a sinus infection.   PMH: Past Medical History:  Diagnosis Date   GERD (gastroesophageal reflux disease)    Gout    High cholesterol    Obese     Surgical History: Past Surgical History:  Procedure Laterality Date   ANAL FISTULOTOMY     HERNIA REPAIR      Home Medications:  Allergies as of 02/15/2022       Reactions   Morphine And Related Other (See Comments)   Aggressive, out of mind    Levaquin [levofloxacin] Rash        Medication List        Accurate as of February 15, 2022 10:55 AM. If you have any questions, ask your nurse or doctor.          doxycycline 100 MG tablet Commonly known as: VIBRA-TABS Take 1 tablet (100 mg total) by mouth 2 (two) times daily.   fenofibrate 160 MG tablet Take 1 tablet (160 mg total) by mouth daily.   ibuprofen 200 MG tablet Commonly known as: ADVIL Take 400 mg by mouth as needed for moderate pain.   Ozempic (0.25 or 0.5 MG/DOSE) 2 MG/3ML Sopn Generic drug: Semaglutide(0.25 or 0.5MG /DOS) Inject 0.5 mg into the skin once a week.   rosuvastatin 5 MG tablet Commonly known as: CRESTOR Take 1 tablet (5 mg total) by mouth daily.        Allergies:  Allergies  Allergen Reactions   Morphine And Related Other (See Comments)    Aggressive, out of mind    Levaquin [Levofloxacin] Rash    Family History: Family History  Problem Relation Age of Onset   Healthy Mother     Healthy Father     Social History:  reports that he has quit smoking. He has quit using smokeless tobacco. He reports current alcohol use. He reports that he does not use drugs.  ROS: All other review of systems were reviewed and are negative except what is noted above in HPI  Physical Exam: BP (!) 147/77   Pulse 75   Constitutional:  Alert and oriented, No acute distress. HEENT: [place] AT, moist mucus membranes.  Trachea midline, no masses. Cardiovascular: No clubbing, cyanosis, or edema. Respiratory: Normal respiratory effort, no increased work of breathing. GI: Abdomen is soft, nontender, nondistended, no abdominal masses GU: No CVA tenderness. Circumcised phallus. No masses/lesions on penis, testis, scrotum. 1cm palpable dorsal proximal penile shaft peyronies plaque which is tender. Prostate 20g smooth no nodules, mildly tender.  Lymph: No cervical or inguinal lymphadenopathy. Skin: No rashes, bruises or suspicious lesions. Neurologic: Grossly intact, no focal deficits, moving all 4 extremities. Psychiatric: Normal mood and affect.  Laboratory Data: Lab Results  Component Value Date   WBC 7.4 08/21/2013   HGB 17.5 (H) 08/21/2013   HCT 47.8 08/21/2013   MCV 90.4 08/21/2013   PLT 146 (L) 08/21/2013    Lab Results  Component Value  Date   CREATININE 1.09 08/21/2013    No results found for: "PSA"  No results found for: "TESTOSTERONE"  No results found for: "HGBA1C"  Urinalysis    Component Value Date/Time   COLORURINE YELLOW 08/21/2013 1905   APPEARANCEUR CLEAR 08/21/2013 1905   LABSPEC 1.029 08/21/2013 1905   PHURINE 5.5 08/21/2013 1905   GLUCOSEU NEGATIVE 08/21/2013 1905   HGBUR NEGATIVE 08/21/2013 1905   BILIRUBINUR NEGATIVE 08/21/2013 1905   KETONESUR NEGATIVE 08/21/2013 1905   PROTEINUR NEGATIVE 08/21/2013 1905   UROBILINOGEN 0.2 08/21/2013 1905   NITRITE NEGATIVE 08/21/2013 1905   LEUKOCYTESUR NEGATIVE 08/21/2013 1905    No results found for: "LABMICR",  "WBCUA", "RBCUA", "LABEPIT", "MUCUS", "BACTERIA"  Pertinent Imaging:  No results found for this or any previous visit.  No results found for this or any previous visit.  No results found for this or any previous visit.  No results found for this or any previous visit.  No results found for this or any previous visit.  No results found for this or any previous visit.  No results found for this or any previous visit.  No results found for this or any previous visit.   Assessment & Plan:    1. Painful ejaculation -bactrim DS BID for 28 days - Urinalysis, Routine w reflex microscopic  2. Peyronies disease We discussed the management of peyronies disease including medical therapy, penile plication, verapamil therapy and xiaflex therapy. After discussed the options the patient elects for medical therapy since he is in the active phase of peyronies disease. I will see him back in 3 months. We will trial trental 400mg  BID for 3 months    No follow-ups on file.  , MD  [facility] [place]

## 2022-03-03 ENCOUNTER — Other Ambulatory Visit (HOSPITAL_COMMUNITY): Payer: Self-pay

## 2022-03-14 ENCOUNTER — Other Ambulatory Visit: Payer: Self-pay | Admitting: Urology

## 2022-03-15 ENCOUNTER — Other Ambulatory Visit (HOSPITAL_COMMUNITY): Payer: Self-pay

## 2022-03-15 MED ORDER — SULFAMETHOXAZOLE-TRIMETHOPRIM 800-160 MG PO TABS
1.0000 | ORAL_TABLET | Freq: Two times a day (BID) | ORAL | 0 refills | Status: DC
  Filled 2022-03-15: qty 56, 28d supply, fill #0

## 2022-04-07 ENCOUNTER — Other Ambulatory Visit (HOSPITAL_COMMUNITY): Payer: Self-pay

## 2022-04-12 ENCOUNTER — Ambulatory Visit (INDEPENDENT_AMBULATORY_CARE_PROVIDER_SITE_OTHER): Payer: No Typology Code available for payment source | Admitting: Urology

## 2022-04-12 ENCOUNTER — Encounter: Payer: Self-pay | Admitting: Urology

## 2022-04-12 ENCOUNTER — Other Ambulatory Visit (HOSPITAL_COMMUNITY): Payer: Self-pay

## 2022-04-12 VITALS — BP 144/76 | HR 76

## 2022-04-12 DIAGNOSIS — N486 Induration penis plastica: Secondary | ICD-10-CM | POA: Diagnosis not present

## 2022-04-12 DIAGNOSIS — N5312 Painful ejaculation: Secondary | ICD-10-CM | POA: Diagnosis not present

## 2022-04-12 MED ORDER — PENTOXIFYLLINE ER 400 MG PO TBCR
400.0000 mg | EXTENDED_RELEASE_TABLET | Freq: Two times a day (BID) | ORAL | 3 refills | Status: DC
  Filled 2022-04-12: qty 60, 30d supply, fill #0
  Filled 2022-05-13: qty 60, 30d supply, fill #1
  Filled 2022-06-08: qty 60, 30d supply, fill #2
  Filled 2022-07-14: qty 60, 30d supply, fill #3

## 2022-04-12 NOTE — Patient Instructions (Signed)
Collagenase Injection (Dupuytren Contracture/Peyronie Disease) What is this medication? COLLAGENASE (kohl LAH jen ace) treats conditions caused by thickening of tissue in your body. It works by breaking down excess collagen in the tissue, which reduces stiffness and tightness. This medicine may be used for other purposes; ask your health care provider or pharmacist if you have questions. COMMON BRAND NAME(S): Xiaflex What should I tell my care team before I take this medication? They need to know if you have any of these conditions: Bleeding disorder An unusual or allergic reaction to collagenase, other medications, foods, dyes, or preservatives Pregnant or trying to get pregnant Breast-feeding How should I use this medication? This medication is injected into the affected area. It is given by your care team in a hospital or clinic setting. A special MedGuide will be given to you by the pharmacist with each prescription and refill. Be sure to read this information carefully each time. Talk to your care team about the use of this medication in children. Special care may be needed. Overdosage: If you think you have taken too much of this medicine contact a poison control center or emergency room at once. NOTE: This medicine is only for you. Do not share this medicine with others. What if I miss a dose? Keep appointments for follow-up doses. It is important not to miss your dose. Call your care team if you are unable to keep an appointment. What may interact with this medication? Aspirin and aspirin-like medications Certain medications that treat or prevent blood clots, such as warfarin, enoxaparin, dalteparin, apixaban, dabigatran, rivaroxaban This list may not describe all possible interactions. Give your health care provider a list of all the medicines, herbs, non-prescription drugs, or dietary supplements you use. Also tell them if you smoke, drink alcohol, or use illegal drugs. Some items may  interact with your medicine. What should I watch for while using this medication? Your condition will be monitored carefully while you are receiving this medication. If medication is for Dupuytren's Contracture, visit your care team 1 to 3 days after the injection. Until you visit your care team, do not flex or extend the fingers of your hand that was injected. Do not touch your finger that was injected. Elevate your hand until bedtime. Do not perform activity with the injected hand until you are told that it is ok. Follow any instructions about wearing a splint or performing finger exercises. Contact your care team as soon as possible if you get increasing redness or swelling in the hand, have numbness or tingling in the treated finger, or have trouble bending the finger after the swelling goes down. If medication is for Peyronie's disease, do not have sex between the first and second injections. Wait 4 weeks after the second injection and when there is no more pain or swelling in the penis to have sex. Avoid using vacuum erection devices during treatment with this medication. Try to avoid straining stomach muscles such as during bowel movements. Your care team will give you instructions on how to perform modeling activities at home. Contact your care team as soon as possible if you have severe pain or swelling in the penis, severe purple bruising and swelling of the penis, trouble passing urine, blood in urine, popping or cracking sound form the penis, or sudden loss of ability to maintain an erection. What side effects may I notice from receiving this medication? Side effects that you should report to your care team as soon as possible: Allergic reactions--skin rash,   itching, hives, swelling of the face, lips, tongue, or throat Feeling faint or lightheaded Skin infection--skin redness, swelling, warmth, or pain Severe back pain, chest pain, headache, trouble breathing after injection Snap or pop that  you feel or hear, severe pain, numbness, swelling, or bruising of or trouble moving in area where injected Side effects that usually do not require medical attention (report to your care team if they continue or are bothersome): Pain, redness, or irritation at injection site This list may not describe all possible side effects. Call your doctor for medical advice about side effects. You may report side effects to FDA at 1-800-FDA-1088. Where should I keep my medication? This medication is given in a hospital or clinic. It will not be stored at home. NOTE: This sheet is a summary. It may not cover all possible information. If you have questions about this medicine, talk to your doctor, pharmacist, or health care provider.  2023 Elsevier/Gold Standard (2021-01-07 00:00:00)  

## 2022-04-12 NOTE — Progress Notes (Signed)
04/12/2022 11:08 AM   Marc Black 1976-02-05 [id number]  Referring provider: Kirstie Peri, MD [address] [place],  [place] [address]  Followuop penile pain and peyronies disease   HPI: Marc Black is a 46yo here for followup for peyronies disease and penile pain he finished bactrim DS which significantly improved his LUTS and resolved his dysuria. Based on pictures supplied by patient he has 45 degree dorsal/left lateral curvature at the mid penile shaft to base. He has decreased pain with erections. He has no pain with ejaculation currently. No significant LUTS.    PMH: Past Medical History:  Diagnosis Date   GERD (gastroesophageal reflux disease)    Gout    High cholesterol    Obese     Surgical History: Past Surgical History:  Procedure Laterality Date   ANAL FISTULOTOMY     HERNIA REPAIR      Home Medications:  Allergies as of 04/12/2022       Reactions   Morphine And Related Other (See Comments)   Aggressive, out of mind    Levaquin [levofloxacin] Rash        Medication List        Accurate as of April 12, 2022 11:08 AM. If you have any questions, ask your nurse or doctor.          doxycycline 100 MG tablet Commonly known as: VIBRA-TABS Take 1 tablet (100 mg total) by mouth 2 (two) times daily.   fenofibrate 160 MG tablet Take 1 tablet (160 mg total) by mouth daily.   ibuprofen 200 MG tablet Commonly known as: ADVIL Take 400 mg by mouth as needed for moderate pain.   Ozempic (0.25 or 0.5 MG/DOSE) 2 MG/3ML Sopn Generic drug: Semaglutide(0.25 or 0.5MG /DOS) Inject 0.5 mg into the skin once a week.   pentoxifylline 400 MG CR tablet Commonly known as: TRENTAL Take 1 tablet (400 mg total) by mouth 2 (two) times daily.   rosuvastatin 5 MG tablet Commonly known as: CRESTOR Take 1 tablet (5 mg total) by mouth daily.   sulfamethoxazole-trimethoprim 800-160 MG tablet Commonly known as: BACTRIM DS Take 1 tablet by mouth 2 (two) times daily.         Allergies:  Allergies  Allergen Reactions   Morphine And Related Other (See Comments)    Aggressive, out of mind    Levaquin [Levofloxacin] Rash    Family History: Family History  Problem Relation Age of Onset   Healthy Mother    Healthy Father     Social History:  reports that he has quit smoking. He has quit using smokeless tobacco. He reports current alcohol use. He reports that he does not use drugs.  ROS: All other review of systems were reviewed and are negative except what is noted above in HPI  Physical Exam: BP (!) 144/76   Pulse 76   Constitutional:  Alert and oriented, No acute distress. HEENT: [place] AT, moist mucus membranes.  Trachea midline, no masses. Cardiovascular: No clubbing, cyanosis, or edema. Respiratory: Normal respiratory effort, no increased work of breathing. GI: Abdomen is soft, nontender, nondistended, no abdominal masses GU: No CVA tenderness.  Lymph: No cervical or inguinal lymphadenopathy. Skin: No rashes, bruises or suspicious lesions. Neurologic: Grossly intact, no focal deficits, moving all 4 extremities. Psychiatric: Normal mood and affect.  Laboratory Data: Lab Results  Component Value Date   WBC 7.4 08/21/2013   HGB 17.5 (H) 08/21/2013   HCT 47.8 08/21/2013   MCV 90.4 08/21/2013   PLT  146 (L) 08/21/2013    Lab Results  Component Value Date   CREATININE 1.09 08/21/2013    No results found for: "PSA"  No results found for: "TESTOSTERONE"  No results found for: "HGBA1C"  Urinalysis    Component Value Date/Time   COLORURINE YELLOW 08/21/2013 1905   APPEARANCEUR Clear 02/15/2022 1030   LABSPEC 1.029 08/21/2013 1905   PHURINE 5.5 08/21/2013 1905   GLUCOSEU Negative 02/15/2022 1030   HGBUR NEGATIVE 08/21/2013 1905   BILIRUBINUR Negative 02/15/2022 1030   KETONESUR NEGATIVE 08/21/2013 1905   PROTEINUR Negative 02/15/2022 1030   PROTEINUR NEGATIVE 08/21/2013 1905   UROBILINOGEN 0.2 08/21/2013 1905   NITRITE  Negative 02/15/2022 1030   NITRITE NEGATIVE 08/21/2013 1905   LEUKOCYTESUR Negative 02/15/2022 1030    Lab Results  Component Value Date   LABMICR Comment 02/15/2022    Pertinent Imaging:  No results found for this or any previous visit.  No results found for this or any previous visit.  No results found for this or any previous visit.  No results found for this or any previous visit.  No results found for this or any previous visit.  No valid procedures specified. No results found for this or any previous visit.  No results found for this or any previous visit.   Assessment & Plan:    1. Painful ejaculation - - Urinalysis, Routine w reflex microscopic  2. Peyronie disease -Continue trental 400mg  BID for 3 months   No follow-ups on file.  Nicolette Bang, MD  [facility] [place]

## 2022-04-13 LAB — URINALYSIS, ROUTINE W REFLEX MICROSCOPIC
Bilirubin, UA: NEGATIVE
Glucose, UA: NEGATIVE
Ketones, UA: NEGATIVE
Leukocytes,UA: NEGATIVE
Nitrite, UA: NEGATIVE
Protein,UA: NEGATIVE
RBC, UA: NEGATIVE
Specific Gravity, UA: 1.025 (ref 1.005–1.030)
Urobilinogen, Ur: 1 mg/dL (ref 0.2–1.0)
pH, UA: 6.5 (ref 5.0–7.5)

## 2022-05-02 ENCOUNTER — Other Ambulatory Visit (HOSPITAL_COMMUNITY): Payer: Self-pay

## 2022-05-02 MED ORDER — ROSUVASTATIN CALCIUM 5 MG PO TABS
5.0000 mg | ORAL_TABLET | Freq: Every day | ORAL | 1 refills | Status: DC
  Filled 2022-05-02: qty 90, 90d supply, fill #0
  Filled 2022-06-08 – 2022-08-02 (×2): qty 90, 90d supply, fill #1

## 2022-06-07 DIAGNOSIS — E118 Type 2 diabetes mellitus with unspecified complications: Secondary | ICD-10-CM | POA: Diagnosis not present

## 2022-06-07 DIAGNOSIS — E782 Mixed hyperlipidemia: Secondary | ICD-10-CM | POA: Diagnosis not present

## 2022-06-08 ENCOUNTER — Other Ambulatory Visit (HOSPITAL_COMMUNITY): Payer: Self-pay

## 2022-06-09 ENCOUNTER — Other Ambulatory Visit: Payer: Self-pay

## 2022-06-09 ENCOUNTER — Other Ambulatory Visit (HOSPITAL_COMMUNITY): Payer: Self-pay

## 2022-06-09 MED ORDER — FENOFIBRATE 160 MG PO TABS
160.0000 mg | ORAL_TABLET | Freq: Every day | ORAL | 0 refills | Status: DC
  Filled 2022-06-09: qty 90, 90d supply, fill #0

## 2022-07-11 ENCOUNTER — Other Ambulatory Visit (HOSPITAL_COMMUNITY): Payer: Self-pay

## 2022-07-11 MED ORDER — MECLIZINE HCL 25 MG PO TABS
25.0000 mg | ORAL_TABLET | Freq: Every day | ORAL | 0 refills | Status: AC
  Filled 2022-07-11: qty 30, 30d supply, fill #0

## 2022-07-18 ENCOUNTER — Encounter: Payer: Self-pay | Admitting: Urology

## 2022-07-18 ENCOUNTER — Ambulatory Visit (INDEPENDENT_AMBULATORY_CARE_PROVIDER_SITE_OTHER): Payer: 59 | Admitting: Urology

## 2022-07-18 VITALS — BP 113/74 | HR 64

## 2022-07-18 DIAGNOSIS — N486 Induration penis plastica: Secondary | ICD-10-CM | POA: Diagnosis not present

## 2022-07-18 NOTE — Patient Instructions (Signed)
Collagenase Injection (Dupuytren Contracture/Peyronie Disease) What is this medication? COLLAGENASE (kohl LAH jen ace) treats conditions caused by thickening of tissue in your body. It works by breaking down excess collagen in the tissue, which reduces stiffness and tightness. This medicine may be used for other purposes; ask your health care provider or pharmacist if you have questions. COMMON BRAND NAME(S): Xiaflex What should I tell my care team before I take this medication? They need to know if you have any of these conditions: Bleeding disorder An unusual or allergic reaction to collagenase, other medications, foods, dyes, or preservatives Pregnant or trying to get pregnant Breast-feeding How should I use this medication? This medication is injected into the affected area. It is given by your care team in a hospital or clinic setting. A special MedGuide will be given to you by the pharmacist with each prescription and refill. Be sure to read this information carefully each time. Talk to your care team about the use of this medication in children. Special care may be needed. Overdosage: If you think you have taken too much of this medicine contact a poison control center or emergency room at once. NOTE: This medicine is only for you. Do not share this medicine with others. What if I miss a dose? Keep appointments for follow-up doses. It is important not to miss your dose. Call your care team if you are unable to keep an appointment. What may interact with this medication? Aspirin and aspirin-like medications Certain medications that treat or prevent blood clots, such as warfarin, enoxaparin, dalteparin, apixaban, dabigatran, rivaroxaban This list may not describe all possible interactions. Give your health care provider a list of all the medicines, herbs, non-prescription drugs, or dietary supplements you use. Also tell them if you smoke, drink alcohol, or use illegal drugs. Some items may  interact with your medicine. What should I watch for while using this medication? Your condition will be monitored carefully while you are receiving this medication. If medication is for Dupuytren's Contracture, visit your care team 1 to 3 days after the injection. Until you visit your care team, do not flex or extend the fingers of your hand that was injected. Do not touch your finger that was injected. Elevate your hand until bedtime. Do not perform activity with the injected hand until you are told that it is ok. Follow any instructions about wearing a splint or performing finger exercises. Contact your care team as soon as possible if you get increasing redness or swelling in the hand, have numbness or tingling in the treated finger, or have trouble bending the finger after the swelling goes down. If medication is for Peyronie's disease, do not have sex between the first and second injections. Wait 4 weeks after the second injection and when there is no more pain or swelling in the penis to have sex. Avoid using vacuum erection devices during treatment with this medication. Try to avoid straining stomach muscles such as during bowel movements. Your care team will give you instructions on how to perform modeling activities at home. Contact your care team as soon as possible if you have severe pain or swelling in the penis, severe purple bruising and swelling of the penis, trouble passing urine, blood in urine, popping or cracking sound form the penis, or sudden loss of ability to maintain an erection. What side effects may I notice from receiving this medication? Side effects that you should report to your care team as soon as possible: Allergic reactions--skin rash,  itching, hives, swelling of the face, lips, tongue, or throat Feeling faint or lightheaded Skin infection--skin redness, swelling, warmth, or pain Severe back pain, chest pain, headache, trouble breathing after injection Snap or pop that  you feel or hear, severe pain, numbness, swelling, or bruising of or trouble moving in area where injected Side effects that usually do not require medical attention (report to your care team if they continue or are bothersome): Pain, redness, or irritation at injection site This list may not describe all possible side effects. Call your doctor for medical advice about side effects. You may report side effects to FDA at 1-800-FDA-1088. Where should I keep my medication? This medication is given in a hospital or clinic. It will not be stored at home. NOTE: This sheet is a summary. It may not cover all possible information. If you have questions about this medicine, talk to your doctor, pharmacist, or health care provider.  2023 Elsevier/Gold Standard (2021-01-07 00:00:00)

## 2022-07-18 NOTE — Progress Notes (Unsigned)
07/18/2022 10:21 AM   Glennie Hawk 06/28/1975 [id number]  Referring provider: Monico Blitz, MD [address] [place],  [place] [address]  Followup peyronies disease   HPI: Marc Black is a 47yo here for followup for peyronies disease. He notes less pain with erections. He continues to have stable 45 degree dorsal curvature at the mid penile shaft. which makes intercourse difficult. He denies any LUTS. No dysuria.    PMH: Past Medical History:  Diagnosis Date   GERD (gastroesophageal reflux disease)    Gout    High cholesterol    Obese     Surgical History: Past Surgical History:  Procedure Laterality Date   ANAL FISTULOTOMY     HERNIA REPAIR      Home Medications:  Allergies as of 07/18/2022       Reactions   Morphine And Related Other (See Comments)   Aggressive, out of mind    Levaquin [levofloxacin] Rash        Medication List        Accurate as of July 18, 2022 10:21 AM. If you have any questions, ask your nurse or doctor.          doxycycline 100 MG tablet Commonly known as: VIBRA-TABS Take 1 tablet (100 mg total) by mouth 2 (two) times daily.   fenofibrate 160 MG tablet Take 1 tablet (160 mg total) by mouth daily.   ibuprofen 200 MG tablet Commonly known as: ADVIL Take 400 mg by mouth as needed for moderate pain.   meclizine 25 MG tablet Commonly known as: ANTIVERT Take 1 tablet (25 mg total) by mouth daily.   Ozempic (0.25 or 0.5 MG/DOSE) 2 MG/3ML Sopn Generic drug: Semaglutide(0.25 or 0.5MG/DOS) Inject 0.5 mg into the skin once a week.   pentoxifylline 400 MG CR tablet Commonly known as: TRENTAL Take 1 tablet (400 mg total) by mouth 2 (two) times daily.   rosuvastatin 5 MG tablet Commonly known as: CRESTOR Take 1 tablet (5 mg total) by mouth daily.   sulfamethoxazole-trimethoprim 800-160 MG tablet Commonly known as: BACTRIM DS Take 1 tablet by mouth 2 (two) times daily.        Allergies:  Allergies  Allergen Reactions    Morphine And Related Other (See Comments)    Aggressive, out of mind    Levaquin [Levofloxacin] Rash    Family History: Family History  Problem Relation Age of Onset   Healthy Mother    Healthy Father     Social History:  reports that he has quit smoking. He has quit using smokeless tobacco. He reports current alcohol use. He reports that he does not use drugs.  ROS: All other review of systems were reviewed and are negative except what is noted above in HPI  Physical Exam: BP 113/74   Pulse 64   Constitutional:  Alert and oriented, No acute distress. HEENT: [place] AT, moist mucus membranes.  Trachea midline, no masses. Cardiovascular: No clubbing, cyanosis, or edema. Respiratory: Normal respiratory effort, no increased work of breathing. GI: Abdomen is soft, nontender, nondistended, no abdominal masses GU: No CVA tenderness.  Lymph: No cervical or inguinal lymphadenopathy. Skin: No rashes, bruises or suspicious lesions. Neurologic: Grossly intact, no focal deficits, moving all 4 extremities. Psychiatric: Normal mood and affect.  Laboratory Data: Lab Results  Component Value Date   WBC 7.4 08/21/2013   HGB 17.5 (H) 08/21/2013   HCT 47.8 08/21/2013   MCV 90.4 08/21/2013   PLT 146 (L) 08/21/2013    Lab  Results  Component Value Date   CREATININE 1.09 08/21/2013    No results found for: "PSA"  No results found for: "TESTOSTERONE"  No results found for: "HGBA1C"  Urinalysis    Component Value Date/Time   COLORURINE YELLOW 08/21/2013 1905   APPEARANCEUR Clear 04/12/2022 1132   LABSPEC 1.029 08/21/2013 1905   PHURINE 5.5 08/21/2013 1905   GLUCOSEU Negative 04/12/2022 1132   HGBUR NEGATIVE 08/21/2013 1905   BILIRUBINUR Negative 04/12/2022 [place] 08/21/2013 1905   PROTEINUR Negative 04/12/2022 [place] 08/21/2013 1905   UROBILINOGEN 0.2 08/21/2013 1905   NITRITE Negative 04/12/2022 1132   NITRITE NEGATIVE 08/21/2013 1905    LEUKOCYTESUR Negative 04/12/2022 1132    Lab Results  Component Value Date   LABMICR Comment 04/12/2022    Pertinent Imaging:  No results found for this or any previous visit.  No results found for this or any previous visit.  No results found for this or any previous visit.  No results found for this or any previous visit.  No results found for this or any previous visit.  No valid procedures specified. No results found for this or any previous visit.  No results found for this or any previous visit.   Assessment & Plan:    1. Peyronie disease We discussed the management of peyronies disease including medical therapy, penile plication, verapamil therapy and xiaflex therapy. After discussed the options the patient elects for xiaflex therapy    No follow-ups on file.  Nicolette Bang, MD  [facility] [place]

## 2022-07-19 ENCOUNTER — Telehealth: Payer: Self-pay

## 2022-07-19 NOTE — Telephone Encounter (Signed)
Patient wanting to know if he needs to stop pentoxifylline (TRENTAL) 400 MG CR tablet   Please advise.  Call:  [phone]

## 2022-07-19 NOTE — Telephone Encounter (Signed)
Patient states he is having vertigo and would like to d/c this medication.  Verbal from Dr. Alyson Ingles, ok for pt to d/c.  Patient aware of MD response.

## 2022-07-25 ENCOUNTER — Other Ambulatory Visit (HOSPITAL_COMMUNITY): Payer: Self-pay

## 2022-08-01 ENCOUNTER — Telehealth: Payer: Self-pay

## 2022-08-01 NOTE — Telephone Encounter (Signed)
[facility] reports buy/bill for Xiaflex  Patient has [facility]- xiaflex will need to to go [facility] outpatient pharmacy please.   Thanks

## 2022-08-02 ENCOUNTER — Other Ambulatory Visit (HOSPITAL_COMMUNITY): Payer: Self-pay

## 2022-08-02 ENCOUNTER — Other Ambulatory Visit: Payer: Self-pay

## 2022-08-02 ENCOUNTER — Other Ambulatory Visit: Payer: Self-pay | Admitting: Urology

## 2022-08-02 MED ORDER — COLLAGENASE CLOSTRID HISTOLYT 0.9 MG IJ SOLR
1.0000 | INTRAMUSCULAR | 3 refills | Status: DC
  Filled 2022-08-02 (×2): qty 2, 3d supply, fill #0

## 2022-08-02 MED ORDER — OZEMPIC (0.25 OR 0.5 MG/DOSE) 2 MG/3ML ~~LOC~~ SOPN
0.5000 mg | PEN_INJECTOR | SUBCUTANEOUS | 1 refills | Status: DC
  Filled 2022-08-02: qty 9, 84d supply, fill #0
  Filled 2022-10-24: qty 9, 84d supply, fill #1

## 2022-08-02 MED ORDER — COLLAGENASE CLOSTRID HISTOLYT 0.9 MG IJ SOLR
1.0000 | INTRAMUSCULAR | 3 refills | Status: DC
  Filled 2022-08-02: qty 2, 2d supply, fill #0
  Filled 2022-08-09: qty 2, 30d supply, fill #0
  Filled 2022-11-04 – 2022-11-07 (×2): qty 2, 30d supply, fill #1
  Filled 2023-01-17: qty 2, 30d supply, fill #2
  Filled 2023-03-10: qty 2, 30d supply, fill #3

## 2022-08-02 NOTE — Telephone Encounter (Signed)
Patient Xiaflex rx was sent to [facility] can you resend to [facility] please.

## 2022-08-03 ENCOUNTER — Other Ambulatory Visit (HOSPITAL_COMMUNITY): Payer: Self-pay

## 2022-08-03 ENCOUNTER — Other Ambulatory Visit: Payer: Self-pay

## 2022-08-04 ENCOUNTER — Other Ambulatory Visit (HOSPITAL_COMMUNITY): Payer: Self-pay

## 2022-08-05 ENCOUNTER — Other Ambulatory Visit (HOSPITAL_COMMUNITY): Payer: Self-pay

## 2022-08-08 ENCOUNTER — Other Ambulatory Visit (HOSPITAL_COMMUNITY): Payer: Self-pay

## 2022-08-09 ENCOUNTER — Other Ambulatory Visit (HOSPITAL_COMMUNITY): Payer: Self-pay

## 2022-08-09 ENCOUNTER — Other Ambulatory Visit: Payer: Self-pay

## 2022-08-11 ENCOUNTER — Other Ambulatory Visit (HOSPITAL_COMMUNITY): Payer: Self-pay

## 2022-08-11 ENCOUNTER — Other Ambulatory Visit: Payer: Self-pay

## 2022-08-15 ENCOUNTER — Other Ambulatory Visit (HOSPITAL_COMMUNITY): Payer: Self-pay

## 2022-08-16 ENCOUNTER — Other Ambulatory Visit (HOSPITAL_COMMUNITY): Payer: Self-pay

## 2022-08-17 ENCOUNTER — Other Ambulatory Visit (HOSPITAL_COMMUNITY): Payer: Self-pay

## 2022-08-18 ENCOUNTER — Other Ambulatory Visit: Payer: Self-pay

## 2022-08-20 ENCOUNTER — Other Ambulatory Visit (HOSPITAL_COMMUNITY): Payer: Self-pay

## 2022-08-22 ENCOUNTER — Other Ambulatory Visit (HOSPITAL_COMMUNITY): Payer: Self-pay

## 2022-08-22 ENCOUNTER — Other Ambulatory Visit: Payer: Self-pay

## 2022-09-12 ENCOUNTER — Other Ambulatory Visit (HOSPITAL_COMMUNITY): Payer: Self-pay

## 2022-09-12 MED ORDER — FENOFIBRATE 160 MG PO TABS
160.0000 mg | ORAL_TABLET | Freq: Every day | ORAL | 0 refills | Status: DC
  Filled 2022-09-12: qty 90, 90d supply, fill #0

## 2022-09-12 MED ORDER — ROSUVASTATIN CALCIUM 5 MG PO TABS
5.0000 mg | ORAL_TABLET | Freq: Every day | ORAL | 1 refills | Status: DC
  Filled 2022-09-12 – 2022-10-24 (×3): qty 90, 90d supply, fill #0
  Filled 2023-02-01: qty 90, 90d supply, fill #1

## 2022-09-13 ENCOUNTER — Other Ambulatory Visit (HOSPITAL_COMMUNITY): Payer: Self-pay

## 2022-09-14 ENCOUNTER — Other Ambulatory Visit (HOSPITAL_COMMUNITY): Payer: Self-pay

## 2022-09-16 ENCOUNTER — Other Ambulatory Visit: Payer: Self-pay

## 2022-09-16 ENCOUNTER — Other Ambulatory Visit (HOSPITAL_COMMUNITY): Payer: Self-pay

## 2022-09-21 ENCOUNTER — Other Ambulatory Visit (HOSPITAL_COMMUNITY): Payer: Self-pay

## 2022-09-26 ENCOUNTER — Ambulatory Visit: Payer: 59 | Admitting: Urology

## 2022-09-26 ENCOUNTER — Other Ambulatory Visit: Payer: Self-pay

## 2022-09-26 MED ORDER — AMBULATORY NON FORMULARY MEDICATION: 3 refills | Status: DC

## 2022-09-28 ENCOUNTER — Ambulatory Visit: Payer: 59 | Admitting: Urology

## 2022-09-28 VITALS — BP 145/90 | HR 84

## 2022-09-28 DIAGNOSIS — N486 Induration penis plastica: Secondary | ICD-10-CM

## 2022-09-28 MED ORDER — PHENYLEPHRINE 100 MCG/ML FOR PRIAPISM (OUTPATIENT ~~LOC~~ UROLOGY USE ONLY)
100.0000 ug | Freq: Once | INTRAMUSCULAR | Status: AC
  Administered 2022-09-28: 100 ug via INTRACAVERNOUS

## 2022-09-28 MED ORDER — COLLAGENASE CLOSTRID HISTOLYT 0.9 MG IJ SOLR
0.9000 mg | Freq: Once | INTRAMUSCULAR | Status: AC
  Administered 2022-09-28: 0.9 mg via INTRAMUSCULAR

## 2022-09-28 NOTE — Progress Notes (Signed)
Xiaflex injection:  Procedure:  Concentration and amount of PGE-1 injected: 1cc of 10g/CC  Approximate size and location of plaque:   Degree of curvature: 70 degree dorsal  Neo-Synephrine 150 g brought brisk detumescence.  Injection procedure:  Using sterile technique an alcohol swab was used to clean the area of injection. The location had been previously marked. A 27-gauge needle was inserted through the skin and into the plaque at the point of maximum concavity with the needle oriented perpendicular to the corpus cavernosum. The needle was advanced transversely through the width of the plaque toward the opposite side of the plaque without passing completely through it. Proper position was confirmed by noting resistance to minimal depression of the syringe plunger. I then began injecting maintaining steady pressure as I withdrew the needle slowly through the transverse width of the plaque depositing the 80mg  of Xiaflex solution 10mg  wastage. The needle was then withdrawn and gentle pressure was applied at the injection site.    He tolerated his injection well. I will therefore have him return in 48 hours for repeat injection.

## 2022-09-30 ENCOUNTER — Ambulatory Visit: Payer: 59 | Admitting: Urology

## 2022-09-30 VITALS — BP 144/80 | HR 94

## 2022-09-30 DIAGNOSIS — N486 Induration penis plastica: Secondary | ICD-10-CM

## 2022-09-30 MED ORDER — COLLAGENASE CLOSTRID HISTOLYT 0.9 MG IJ SOLR
0.9000 mg | Freq: Once | INTRAMUSCULAR | Status: AC
  Administered 2022-09-30: 0.9 mg via INTRAMUSCULAR

## 2022-10-04 ENCOUNTER — Encounter: Payer: Self-pay | Admitting: Urology

## 2022-10-04 NOTE — Patient Instructions (Signed)
Collagenase Injection (Dupuytren Contracture/Peyronie Disease) What is this medication? COLLAGENASE (kohl LAH jen ace) treats conditions caused by thickening of tissue in your body. It works by breaking down excess collagen in the tissue, which reduces stiffness and tightness. This medicine may be used for other purposes; ask your health care provider or pharmacist if you have questions. COMMON BRAND NAME(S): Xiaflex What should I tell my care team before I take this medication? They need to know if you have any of these conditions: Bleeding disorder An unusual or allergic reaction to collagenase, other medications, foods, dyes, or preservatives Pregnant or trying to get pregnant Breast-feeding How should I use this medication? This medication is injected into the affected area. It is given by your care team in a hospital or clinic setting. A special MedGuide will be given to you by the pharmacist with each prescription and refill. Be sure to read this information carefully each time. Talk to your care team about the use of this medication in children. Special care may be needed. Overdosage: If you think you have taken too much of this medicine contact a poison control center or emergency room at once. NOTE: This medicine is only for you. Do not share this medicine with others. What if I miss a dose? Keep appointments for follow-up doses. It is important not to miss your dose. Call your care team if you are unable to keep an appointment. What may interact with this medication? Aspirin and aspirin-like medications Certain medications that treat or prevent blood clots, such as warfarin, enoxaparin, dalteparin, apixaban, dabigatran, rivaroxaban This list may not describe all possible interactions. Give your health care provider a list of all the medicines, herbs, non-prescription drugs, or dietary supplements you use. Also tell them if you smoke, drink alcohol, or use illegal drugs. Some items may  interact with your medicine. What should I watch for while using this medication? Your condition will be monitored carefully while you are receiving this medication. If medication is for Dupuytren's Contracture, visit your care team 1 to 3 days after the injection. Until you visit your care team, do not flex or extend the fingers of your hand that was injected. Do not touch your finger that was injected. Elevate your hand until bedtime. Do not perform activity with the injected hand until you are told that it is ok. Follow any instructions about wearing a splint or performing finger exercises. Contact your care team as soon as possible if you get increasing redness or swelling in the hand, have numbness or tingling in the treated finger, or have trouble bending the finger after the swelling goes down. If medication is for Peyronie's disease, do not have sex between the first and second injections. Wait 4 weeks after the second injection and when there is no more pain or swelling in the penis to have sex. Avoid using vacuum erection devices during treatment with this medication. Try to avoid straining stomach muscles such as during bowel movements. Your care team will give you instructions on how to perform modeling activities at home. Contact your care team as soon as possible if you have severe pain or swelling in the penis, severe purple bruising and swelling of the penis, trouble passing urine, blood in urine, popping or cracking sound form the penis, or sudden loss of ability to maintain an erection. What side effects may I notice from receiving this medication? Side effects that you should report to your care team as soon as possible: Allergic reactions--skin rash,   itching, hives, swelling of the face, lips, tongue, or throat Feeling faint or lightheaded Skin infection--skin redness, swelling, warmth, or pain Severe back pain, chest pain, headache, trouble breathing after injection Snap or pop that  you feel or hear, severe pain, numbness, swelling, or bruising of or trouble moving in area where injected Side effects that usually do not require medical attention (report to your care team if they continue or are bothersome): Pain, redness, or irritation at injection site This list may not describe all possible side effects. Call your doctor for medical advice about side effects. You may report side effects to FDA at 1-800-FDA-1088. Where should I keep my medication? This medication is given in a hospital or clinic. It will not be stored at home. NOTE: This sheet is a summary. It may not cover all possible information. If you have questions about this medicine, talk to your doctor, pharmacist, or health care provider.  2023 Elsevier/Gold Standard (2021-01-07 00:00:00)  

## 2022-10-04 NOTE — Progress Notes (Signed)
Xiaflex second injection:  Injection procedure:  Using sterile technique an alcohol swab was used to clean the area of injection. The location had been previously marked. A 27-gauge needle was inserted through the skin and into the plaque at the point of maximum concavity with the needle oriented perpendicular to the corpus cavernosum. The needle was advanced transversely through the width of the plaque toward the opposite side of the plaque without passing completely through it. Proper position was confirmed by noting resistance to minimal depression of the syringe plunger. I then began injecting maintaining steady pressure as I withdrew the needle slowly through the transverse width of the plaque depositing  0.25 cc  of Xiaflex solution 0cc wastage. The needle was then withdrawn and gentle pressure was applied at the injection site.   He tolerated his injection well. I will therefore have him return in 48 hours for penile modeling.  

## 2022-10-05 ENCOUNTER — Ambulatory Visit: Payer: 59 | Admitting: Urology

## 2022-10-05 ENCOUNTER — Other Ambulatory Visit: Payer: Self-pay

## 2022-10-05 VITALS — BP 117/74 | HR 74

## 2022-10-05 DIAGNOSIS — N486 Induration penis plastica: Secondary | ICD-10-CM

## 2022-10-05 NOTE — Progress Notes (Signed)
10/05/2022 1:20 PM   Marc Black April 10, 1976 [id number]  Referring provider: Kirstie Peri, MD [address] [place],  [place] [address]  Followup peyronies disease  HPI: Marc Black is a 47yo here for followup for peyronies disease. He has mild bruising after second injection. He has mild penile pain   PMH: Past Medical History:  Diagnosis Date   GERD (gastroesophageal reflux disease)    Gout    High cholesterol    Obese     Surgical History: Past Surgical History:  Procedure Laterality Date   ANAL FISTULOTOMY     HERNIA REPAIR      Home Medications:  Allergies as of 10/05/2022       Reactions   Morphine And Related Other (See Comments)   Aggressive, out of mind    Levaquin [levofloxacin] Rash        Medication List        Accurate as of Oct 05, 2022  1:20 PM. If you have any questions, ask your nurse or doctor.          AMBULATORY NON FORMULARY MEDICATION Medication Name: Prostaglandin 10 mcg/ml   doxycycline 100 MG tablet Commonly known as: VIBRA-TABS Take 1 tablet (100 mg total) by mouth 2 (two) times daily.   fenofibrate 160 MG tablet Take 1 tablet (160 mg total) by mouth daily.   ibuprofen 200 MG tablet Commonly known as: ADVIL Take 400 mg by mouth as needed for moderate pain.   meclizine 25 MG tablet Commonly known as: ANTIVERT Take 1 tablet (25 mg total) by mouth daily.   Ozempic (0.25 or 0.5 MG/DOSE) 2 MG/3ML Sopn Generic drug: Semaglutide(0.25 or 0.5MG /DOS) Inject 0.5 mg into the skin once a week.   pentoxifylline 400 MG CR tablet Commonly known as: TRENTAL Take 1 tablet (400 mg total) by mouth 2 (two) times daily.   rosuvastatin 5 MG tablet Commonly known as: CRESTOR Take 1 tablet (5 mg total) by mouth daily.   sulfamethoxazole-trimethoprim 800-160 MG tablet Commonly known as: BACTRIM DS Take 1 tablet by mouth 2 (two) times daily.   Xiaflex 0.9 MG Solr Generic drug: Collagenase Clostrid Histolyt Inject 1 as directed every other  day.        Allergies:  Allergies  Allergen Reactions   Morphine And Related Other (See Comments)    Aggressive, out of mind    Levaquin [Levofloxacin] Rash    Family History: Family History  Problem Relation Age of Onset   Healthy Mother    Healthy Father     Social History:  reports that he has quit smoking. He has quit using smokeless tobacco. He reports current alcohol use. He reports that he does not use drugs.  ROS: All other review of systems were reviewed and are negative except what is noted above in HPI  Physical Exam: BP 117/74   Pulse 74   Constitutional:  Alert and oriented, No acute distress. HEENT: [place] AT, moist mucus membranes.  Trachea midline, no masses. Cardiovascular: No clubbing, cyanosis, or edema. Respiratory: Normal respiratory effort, no increased work of breathing. GI: Abdomen is soft, nontender, nondistended, no abdominal masses GU: No CVA tenderness.  Lymph: No cervical or inguinal lymphadenopathy. Skin: No rashes, bruises or suspicious lesions. Neurologic: Grossly intact, no focal deficits, moving all 4 extremities. Psychiatric: Normal mood and affect.  Laboratory Data: Lab Results  Component Value Date   WBC 7.4 08/21/2013   HGB 17.5 (H) 08/21/2013   HCT 47.8 08/21/2013   MCV 90.4 08/21/2013  PLT 146 (L) 08/21/2013    Lab Results  Component Value Date   CREATININE 1.09 08/21/2013    No results found for: "PSA"  No results found for: "TESTOSTERONE"  No results found for: "HGBA1C"  Urinalysis    Component Value Date/Time   COLORURINE YELLOW 08/21/2013 1905   APPEARANCEUR Clear 04/12/2022 1132   LABSPEC 1.029 08/21/2013 1905   PHURINE 5.5 08/21/2013 1905   GLUCOSEU Negative 04/12/2022 1132   HGBUR NEGATIVE 08/21/2013 1905   BILIRUBINUR Negative 04/12/2022 1132   KETONESUR NEGATIVE 08/21/2013 1905   PROTEINUR Negative 04/12/2022 1132   PROTEINUR NEGATIVE 08/21/2013 1905   UROBILINOGEN 0.2 08/21/2013 1905   NITRITE  Negative 04/12/2022 1132   NITRITE NEGATIVE 08/21/2013 1905   LEUKOCYTESUR Negative 04/12/2022 1132    Lab Results  Component Value Date   LABMICR Comment 04/12/2022    Pertinent Imaging:  No results found for this or any previous visit.  No results found for this or any previous visit.  No results found for this or any previous visit.  No results found for this or any previous visit.  No results found for this or any previous visit.  No valid procedures specified. No results found for this or any previous visit.  No results found for this or any previous visit.   Assessment & Plan:    1. Peyronie disease Penile modeling demonstrated to the patient. He is instructed to refrain from sexual activity for 4 weeks   No follow-ups on file.  Wilkie Aye, MD  [facility] [place]

## 2022-10-11 ENCOUNTER — Encounter: Payer: Self-pay | Admitting: Urology

## 2022-10-11 NOTE — Patient Instructions (Signed)
Collagenase Injection (Dupuytren Contracture/Peyronie Disease) What is this medication? COLLAGENASE (kohl LAH jen ace) treats conditions caused by thickening of tissue in your body. It works by breaking down excess collagen in the tissue, which reduces stiffness and tightness. This medicine may be used for other purposes; ask your health care provider or pharmacist if you have questions. COMMON BRAND NAME(S): Xiaflex What should I tell my care team before I take this medication? They need to know if you have any of these conditions: Bleeding disorder An unusual or allergic reaction to collagenase, other medications, foods, dyes, or preservatives Pregnant or trying to get pregnant Breast-feeding How should I use this medication? This medication is injected into the affected area. It is given by your care team in a hospital or clinic setting. A special MedGuide will be given to you by the pharmacist with each prescription and refill. Be sure to read this information carefully each time. Talk to your care team about the use of this medication in children. Special care may be needed. Overdosage: If you think you have taken too much of this medicine contact a poison control center or emergency room at once. NOTE: This medicine is only for you. Do not share this medicine with others. What if I miss a dose? Keep appointments for follow-up doses. It is important not to miss your dose. Call your care team if you are unable to keep an appointment. What may interact with this medication? Aspirin and aspirin-like medications Certain medications that treat or prevent blood clots, such as warfarin, enoxaparin, dalteparin, apixaban, dabigatran, rivaroxaban This list may not describe all possible interactions. Give your health care provider a list of all the medicines, herbs, non-prescription drugs, or dietary supplements you use. Also tell them if you smoke, drink alcohol, or use illegal drugs. Some items may  interact with your medicine. What should I watch for while using this medication? Your condition will be monitored carefully while you are receiving this medication. If medication is for Dupuytren's Contracture, visit your care team 1 to 3 days after the injection. Until you visit your care team, do not flex or extend the fingers of your hand that was injected. Do not touch your finger that was injected. Elevate your hand until bedtime. Do not perform activity with the injected hand until you are told that it is ok. Follow any instructions about wearing a splint or performing finger exercises. Contact your care team as soon as possible if you get increasing redness or swelling in the hand, have numbness or tingling in the treated finger, or have trouble bending the finger after the swelling goes down. If medication is for Peyronie's disease, do not have sex between the first and second injections. Wait 4 weeks after the second injection and when there is no more pain or swelling in the penis to have sex. Avoid using vacuum erection devices during treatment with this medication. Try to avoid straining stomach muscles such as during bowel movements. Your care team will give you instructions on how to perform modeling activities at home. Contact your care team as soon as possible if you have severe pain or swelling in the penis, severe purple bruising and swelling of the penis, trouble passing urine, blood in urine, popping or cracking sound form the penis, or sudden loss of ability to maintain an erection. What side effects may I notice from receiving this medication? Side effects that you should report to your care team as soon as possible: Allergic reactions--skin rash,   itching, hives, swelling of the face, lips, tongue, or throat Feeling faint or lightheaded Skin infection--skin redness, swelling, warmth, or pain Severe back pain, chest pain, headache, trouble breathing after injection Snap or pop that  you feel or hear, severe pain, numbness, swelling, or bruising of or trouble moving in area where injected Side effects that usually do not require medical attention (report to your care team if they continue or are bothersome): Pain, redness, or irritation at injection site This list may not describe all possible side effects. Call your doctor for medical advice about side effects. You may report side effects to FDA at 1-800-FDA-1088. Where should I keep my medication? This medication is given in a hospital or clinic. It will not be stored at home. NOTE: This sheet is a summary. It may not cover all possible information. If you have questions about this medicine, talk to your doctor, pharmacist, or health care provider.  2023 Elsevier/Gold Standard (2021-01-07 00:00:00)  

## 2022-10-13 ENCOUNTER — Other Ambulatory Visit (HOSPITAL_COMMUNITY): Payer: Self-pay

## 2022-10-20 DIAGNOSIS — Z7984 Long term (current) use of oral hypoglycemic drugs: Secondary | ICD-10-CM | POA: Diagnosis not present

## 2022-10-20 DIAGNOSIS — E119 Type 2 diabetes mellitus without complications: Secondary | ICD-10-CM | POA: Diagnosis not present

## 2022-10-24 ENCOUNTER — Other Ambulatory Visit (HOSPITAL_COMMUNITY): Payer: Self-pay

## 2022-10-27 DIAGNOSIS — E782 Mixed hyperlipidemia: Secondary | ICD-10-CM | POA: Diagnosis not present

## 2022-10-27 DIAGNOSIS — E118 Type 2 diabetes mellitus with unspecified complications: Secondary | ICD-10-CM | POA: Diagnosis not present

## 2022-11-04 ENCOUNTER — Telehealth: Payer: Self-pay

## 2022-11-04 ENCOUNTER — Other Ambulatory Visit (HOSPITAL_COMMUNITY): Payer: Self-pay

## 2022-11-04 NOTE — Telephone Encounter (Signed)
Return call to patient and made him aware of upcoming appt for his Xiaflex and molding. Patient is aware to have his Xiaflex ship to our office and we will call patient when his Dineen Kid is ship to our office for him to get his prostaglandin. Patient voiced understanding

## 2022-11-04 NOTE — Telephone Encounter (Signed)
Patient needing a call back. Has questions regarding his appointment.  Needing to speak with a nurse.  Please advise.

## 2022-11-04 NOTE — Telephone Encounter (Signed)
Open in error

## 2022-11-07 ENCOUNTER — Other Ambulatory Visit: Payer: Self-pay

## 2022-11-07 ENCOUNTER — Other Ambulatory Visit (HOSPITAL_COMMUNITY): Payer: Self-pay | Admitting: Internal Medicine

## 2022-11-07 ENCOUNTER — Other Ambulatory Visit (HOSPITAL_COMMUNITY): Payer: Self-pay

## 2022-11-07 DIAGNOSIS — E782 Mixed hyperlipidemia: Secondary | ICD-10-CM

## 2022-11-07 MED ORDER — PANTOPRAZOLE SODIUM 40 MG PO TBEC
40.0000 mg | DELAYED_RELEASE_TABLET | Freq: Every day | ORAL | 1 refills | Status: DC
  Filled 2022-11-07: qty 90, 90d supply, fill #0
  Filled 2023-02-01: qty 90, 90d supply, fill #1

## 2022-11-09 ENCOUNTER — Other Ambulatory Visit (HOSPITAL_COMMUNITY): Payer: Self-pay

## 2022-11-10 ENCOUNTER — Other Ambulatory Visit: Payer: Self-pay

## 2022-11-15 ENCOUNTER — Other Ambulatory Visit (HOSPITAL_COMMUNITY): Payer: Self-pay

## 2022-11-15 ENCOUNTER — Emergency Department (HOSPITAL_BASED_OUTPATIENT_CLINIC_OR_DEPARTMENT_OTHER)
Admission: EM | Admit: 2022-11-15 | Discharge: 2022-11-15 | Disposition: A | Discharge status: Home / Self Care | Payer: 59 | Attending: Emergency Medicine | Admitting: Emergency Medicine

## 2022-11-15 ENCOUNTER — Emergency Department (HOSPITAL_BASED_OUTPATIENT_CLINIC_OR_DEPARTMENT_OTHER): Payer: 59

## 2022-11-15 ENCOUNTER — Other Ambulatory Visit: Payer: Self-pay

## 2022-11-15 ENCOUNTER — Encounter (HOSPITAL_BASED_OUTPATIENT_CLINIC_OR_DEPARTMENT_OTHER): Payer: Self-pay

## 2022-11-15 DIAGNOSIS — Z79899 Other long term (current) drug therapy: Secondary | ICD-10-CM | POA: Insufficient documentation

## 2022-11-15 DIAGNOSIS — R002 Palpitations: Secondary | ICD-10-CM | POA: Diagnosis present

## 2022-11-15 DIAGNOSIS — I4891 Unspecified atrial fibrillation: Secondary | ICD-10-CM | POA: Diagnosis not present

## 2022-11-15 DIAGNOSIS — R944 Abnormal results of kidney function studies: Secondary | ICD-10-CM | POA: Insufficient documentation

## 2022-11-15 DIAGNOSIS — Z87891 Personal history of nicotine dependence: Secondary | ICD-10-CM | POA: Insufficient documentation

## 2022-11-15 LAB — BASIC METABOLIC PANEL
Anion gap: 10 (ref 5–15)
BUN: 19 mg/dL (ref 6–20)
CO2: 27 mmol/L (ref 22–32)
Calcium: 10.5 mg/dL — ABNORMAL HIGH (ref 8.9–10.3)
Chloride: 102 mmol/L (ref 98–111)
Creatinine, Ser: 1.35 mg/dL — ABNORMAL HIGH (ref 0.61–1.24)
GFR, Estimated: >60 mL/min (ref >60)
Glucose, Bld: 128 mg/dL — ABNORMAL HIGH (ref 70–99)
Potassium: 3.5 mmol/L (ref 3.5–5.1)
Sodium: 139 mmol/L (ref 135–145)

## 2022-11-15 LAB — CBC
HCT: 45.8 % (ref 39.0–52.0)
Hemoglobin: 16.7 g/dL (ref 13.0–17.0)
MCH: 32.6 pg (ref 26.0–34.0)
MCHC: 36.5 g/dL — ABNORMAL HIGH (ref 30.0–36.0)
MCV: 89.5 fL (ref 80.0–100.0)
Platelets: 190 10*3/uL (ref 150–400)
RBC: 5.12 MIL/uL (ref 4.22–5.81)
RDW: 12.5 % (ref 11.5–15.5)
WBC: 5.3 10*3/uL (ref 4.0–10.5)
nRBC: 0 % (ref 0.0–0.2)

## 2022-11-15 LAB — MAGNESIUM: Magnesium: 2.1 mg/dL (ref 1.7–2.4)

## 2022-11-15 MED ORDER — DILTIAZEM HCL ER COATED BEADS 120 MG PO CP24
120.0000 mg | ORAL_CAPSULE | Freq: Every day | ORAL | 0 refills | Status: DC
  Filled 2022-11-15: qty 30, 30d supply, fill #0

## 2022-11-15 MED ORDER — SODIUM CHLORIDE 0.9 % IV BOLUS
1000.0000 mL | Freq: Once | INTRAVENOUS | Status: AC
  Administered 2022-11-15: 1000 mL via INTRAVENOUS

## 2022-11-15 MED ORDER — DILTIAZEM HCL ER COATED BEADS 120 MG PO CP24
120.0000 mg | ORAL_CAPSULE | Freq: Once | ORAL | Status: AC
  Administered 2022-11-15: 120 mg via ORAL
  Filled 2022-11-15: qty 1

## 2022-11-15 NOTE — ED Provider Notes (Signed)
[place] EMERGENCY DEPARTMENT AT [facility] Provider Note   CSN: [id number] Arrival date & time: 11/15/22  1926     History  Chief Complaint  Patient presents with   Palpitations    Marc Black is a 47 y.o. male.  47 year old male presents for evaluation of palpitations and shortness of breath onset around 4:00 today.  Patient had about 3 cups of coffee today and a total [place] raspberry tea, states this is his usual caffeine intake.  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  History of hyperlipidemia, GERD, obesity.  Quit smoking 20 years ago.  No known cardiac history.       Home Medications Prior to Admission medications   Medication Sig Start Date End Date Taking? Authorizing Provider  diltiazem (CARDIZEM CD) 120 MG 24 hr capsule Take 1 capsule (120 mg total) by mouth daily. 11/15/22  Yes Jeannie Fend, PA-C  AMBULATORY NON FORMULARY MEDICATION Medication Name: Prostaglandin 10 mcg/ml 09/26/22   McKenzie, Mardene Celeste, MD  Collagenase Clostrid Histolyt 0.9 MG SOLR Inject 1 as directed every other day. 08/02/22   McKenzie, Mardene Celeste, MD  doxycycline (VIBRA-TABS) 100 MG tablet Take 1 tablet (100 mg total) by mouth 2 (two) times daily. 01/24/22   Margaretann Loveless, PA-C  fenofibrate 160 MG tablet Take 1 tablet (160 mg total) by mouth daily. 09/12/22     ibuprofen (ADVIL,MOTRIN) 200 MG tablet Take 400 mg by mouth as needed for moderate pain.    [provider]  meclizine (ANTIVERT) 25 MG tablet Take 1 tablet (25 mg total) by mouth daily. 07/11/22     pantoprazole (PROTONIX) 40 MG tablet Take 1 tablet (40 mg total) by mouth daily. 11/07/22     pentoxifylline (TRENTAL) 400 MG CR tablet Take 1 tablet (400 mg total) by mouth 2 (two) times daily. 04/12/22   McKenzie, Mardene Celeste, MD  rosuvastatin (CRESTOR) 5 MG tablet Take 1 tablet (5 mg total) by mouth daily. 09/12/22      Semaglutide,0.25 or 0.5MG /DOS, (OZEMPIC, 0.25 OR 0.5 MG/DOSE,) 2 MG/3ML SOPN Inject 0.5 mg into the skin once a week. 08/02/22     sulfamethoxazole-trimethoprim (BACTRIM DS) 800-160 MG tablet Take 1 tablet by mouth 2 (two) times daily. 03/15/22   McKenzie, Mardene Celeste, MD  gemfibrozil (LOPID) 600 MG tablet Take 600 mg by mouth 2 (two) times daily before a meal.  07/26/19  [provider]      Allergies    Morphine and codeine and Levaquin [levofloxacin]    Review of Systems   Review of Systems Negative except as per HPI Physical Exam Updated Vital Signs BP (!) 145/93   Pulse 92   Temp 98.4 F (36.9 C) (Oral)   Resp 18   Ht 6' (1.829 m)   Wt 116.6 kg   SpO2 100%   BMI 34.86 kg/m  Physical Exam Vitals and nursing note reviewed.  Constitutional:      General: He is not in acute distress.    Appearance: He is well-developed. He is not diaphoretic.  HENT:     Head: Normocephalic and atraumatic.  Eyes:     Conjunctiva/sclera: Conjunctivae normal.  Cardiovascular:     Rate and Rhythm: Tachycardia present. Rhythm irregular.     Heart sounds: Normal heart sounds.  Pulmonary:     Effort: Pulmonary effort is normal.     Breath sounds: Normal  breath sounds.  Abdominal:     Palpations: Abdomen is soft.     Tenderness: There is no abdominal tenderness.  Musculoskeletal:     Right lower leg: No edema.     Left lower leg: No edema.  Skin:    General: Skin is warm and dry.     Findings: No erythema or rash.  Neurological:     Mental Status: He is alert and oriented to person, place, and time.  Psychiatric:        Behavior: Behavior normal.     ED Results / Procedures / Treatments   Labs (all labs ordered are listed, but only abnormal results are displayed) Labs Reviewed  BASIC METABOLIC PANEL - Abnormal; Notable for the following components:      Result Value   Glucose, Bld 128 (*)    Creatinine, Ser 1.35 (*)    Calcium 10.5 (*)    All other components within  normal limits  CBC - Abnormal; Notable for the following components:   MCHC 36.5 (*)    All other components within normal limits  MAGNESIUM    EKG EKG Interpretation  Date/Time:  Tuesday November 15 2022 19:32:28 EDT Ventricular Rate:  113 PR Interval:    QRS Duration: 95 QT Interval:  337 QTC Calculation: 462 R Axis:   1 Text Interpretation: Atrial fibrillation Low voltage, extremity leads Probable anteroseptal infarct, old Borderline repolarization abnormality afib new since previous Confirmed by Richardean Canal ([id number]) on 11/15/2022 7:49:09 PM  Radiology DG Chest Port 1 View  Result Date: 11/15/2022 CLINICAL DATA:  Atrial fibrillation. EXAM: PORTABLE CHEST 1 VIEW COMPARISON:  Oct 21, 2019 FINDINGS: The heart size and mediastinal contours are within normal limits. Both lungs are clear. The visualized skeletal structures are unremarkable. IMPRESSION: No active disease. Electronically Signed   By: Aram Candela M.D.   On: 11/15/2022 20:35    Procedures Procedures    Medications Ordered in ED Medications  sodium chloride 0.9 % bolus 1,000 mL (1,000 mLs Intravenous New Bag/Given 11/15/22 1956)  diltiazem (CARDIZEM CD) 24 hr capsule 120 mg (120 mg Oral Given 11/15/22 1958)    ED Course/ Medical Decision Making/ A&P         CHA2DS2-VASc Score: 0                    Medical Decision Making Amount and/or Complexity of Data Reviewed Labs: ordered. Radiology: ordered.  Risk Prescription drug management.   This patient presents to the ED for concern of palpitations, this involves an extensive number of treatment options, and is a complaint that carries with it a high risk of complications and morbidity.  The differential diagnosis includes arrhythmia, electrolyte disturbance    Co morbidities that complicate the patient evaluation  GERD, hyperlipidemia, obesity   Additional history obtained:  External records from outside source obtained and reviewed including no recent  labs on file for review   Lab Tests:  I Ordered, and personally interpreted labs.  The pertinent results include: CBC without significant findings.  Magnesium within the limits.  BMP with mildly elevated creatinine at 1.35, no recent on file for comparison.   Imaging Studies ordered:  I ordered imaging studies including chest x-ray I independently visualized and interpreted imaging which showed no acute process I agree with the radiologist interpretation   Cardiac Monitoring: / EKG:  The patient was maintained on a cardiac monitor.  I personally viewed and interpreted the cardiac monitored which showed an underlying  rhythm of: A-fib, rate 113   Consultations Obtained:  I requested consultation with the ER attending, Dr. Antony Haste,  and discussed lab and imaging findings as well as pertinent plan - they recommend: Cardizem CD 120 mg, can discharge to follow-up with A-fib clinic if rate improved and symptoms resolved.   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  Patient was provided with IV fluids, Cardizem.  Rate improved, symptoms remained controlled.  Patient is discharged with referral to A-fib clinic with prescription for Cardizem.  CHA2DS2-VASc score 0. I ordered medication including Cardizem for A-fib with rate of 113 Reevaluation of the patient after these medicines showed that the patient improved I have reviewed the patients home medicines and have made adjustments as needed   Social Determinants of Health:  Has PCP, is provided with ambulatory referral to cardiology for follow-up   Test / Admission - Considered:  Rate improved, blood pressure stable, patient's symptoms improved.  Plan is to follow-up with A-fib clinic, will discharge with  Cardizem.         Final Clinical Impression(s) / ED Diagnoses Final diagnoses:  Atrial fibrillation, unspecified type [facility])    Rx / DC Orders ED Discharge Orders          Ordered    Amb referral to AFIB Clinic        11/15/22 1949    diltiazem (CARDIZEM CD) 120 MG 24 hr capsule  Daily        11/15/22 2109              Jeannie Fend, PA-C 11/15/22 2134    Charlynne Pander, MD 11/15/22 [phone]

## 2022-11-15 NOTE — Discharge Instructions (Addendum)
Avoid caffeine. Take Cardizem daily as prescribed. Follow up with a-fib clinic, referral placed tonight- you should be contacted to schedule an appointment. Return to the ER for any worsening or concerning symptoms.

## 2022-11-15 NOTE — ED Triage Notes (Signed)
Onset about 1600 of heart palpations associated with SOB.  Started after drinking coffee.  Denies CP but feeling anxiety.

## 2022-11-16 ENCOUNTER — Other Ambulatory Visit: Payer: Self-pay

## 2022-11-16 ENCOUNTER — Other Ambulatory Visit (HOSPITAL_COMMUNITY): Payer: Self-pay

## 2022-11-21 ENCOUNTER — Encounter (HOSPITAL_COMMUNITY): Payer: Self-pay | Admitting: Internal Medicine

## 2022-11-21 ENCOUNTER — Ambulatory Visit (HOSPITAL_COMMUNITY)
Admission: RE | Admit: 2022-11-21 | Discharge: 2022-11-21 | Disposition: A | Discharge status: Home / Self Care | Payer: 59 | Source: Ambulatory Visit | Attending: Internal Medicine | Admitting: Internal Medicine

## 2022-11-21 VITALS — BP 122/82 | HR 64 | Ht 72.0 in | Wt 255.2 lb

## 2022-11-21 DIAGNOSIS — Z87891 Personal history of nicotine dependence: Secondary | ICD-10-CM | POA: Insufficient documentation

## 2022-11-21 DIAGNOSIS — I480 Paroxysmal atrial fibrillation: Secondary | ICD-10-CM | POA: Diagnosis not present

## 2022-11-21 NOTE — Patient Instructions (Signed)
Stop Cardizem once finished current prescription

## 2022-11-21 NOTE — Progress Notes (Signed)
    Primary Care Physician: Kirstie Peri, MD Primary Cardiologist: None Electrophysiologist: None     Referring Physician: Dr. Georgena Spurling Marc Black is a 47 y.o. male with a history of HLD, GERD, and history of tobacco use 20 years ago, and paroxysmal atrial fibrillation who presents for consultation in the [facility]. The patient was initially diagnosed with atrial fibrillation with RVR on 6/11 after presenting to ED with symptoms of palpitations and SOB. He was discharged on diltiazem 120 mg daily. He admits to normally consuming 6 cups of coffee and 2 diet mountain diet sodas daily. Patient is not on anticoagulation. He has a CHADS2VASC score of zero.  On evaluation today, he is currently in NSR. He felt abrupt onset of symptoms and the following day when he felt 100% improved and had converted to NSR. Total time of Afib period was less than 24 hours. He has not had symptoms since then. He has stopped his caffeine consumption completely and admits to having a headache. Sleep study 2 years ago with finding of mild OSA and insurance would not cover treatment due to his score being so low.     he has a BMI of Body mass index is 34.61 kg/m.Marland Kitchen Filed Weights   11/21/22 0844  Weight: 115.8 kg     Atrial Fibrillation Management history:  Previous antiarrhythmic drugs: None Previous cardioversions: None Previous ablations: None Anticoagulation history: None    ROS- All systems are reviewed and negative except as per the HPI above.  Physical Exam: BP 122/82   Pulse 64   Ht 6' (1.829 m)   Wt 115.8 kg   BMI 34.61 kg/m   GEN: Well nourished, well developed in no acute distress NECK: No JVD; No carotid bruits CARDIAC: Regular rate and rhythm, no murmurs, rubs, gallops RESPIRATORY:  Clear to auscultation without rales, wheezing or rhonchi  ABDOMEN: Soft, non-tender, non-distended EXTREMITIES:  No edema; No deformity   EKG today demonstrates  Vent.  rate 64 BPM PR interval 196 ms QRS duration 100 ms QT/QTcB 398/410 ms P-R-T axes 59 -15 70 Normal sinus rhythm Cannot rule out Anterior infarct , age undetermined Abnormal ECG When compared with ECG of 15-Nov-2022 19:32, PREVIOUS ECG IS PRESENT  Echo: N/A  ASSESSMENT & PLAN CHA2DS2-VASc Score = 0  The patient's score is based upon: CHF History: 0 HTN History: 0 Diabetes History: 0 Stroke History: 0 Vascular Disease History: 0 Age Score: 0 Gender Score: 0       ASSESSMENT AND PLAN: Paroxysmal Atrial Fibrillation (ICD10:  I48.0) The patient's CHA2DS2-VASc score is 0, indicating a 0.2% annual risk of stroke.    Education provided about Afib. Discussion about medication treatments and ablation in detail. After discussion, we will proceed with conservative observation at this time.  He does not meet criteria for anticoagulation.  Plan to discontinue diltiazem after he completes prescription. Monitor heart rhythm at home for burden.   Follow up 3 months Afib clinic.   Marc Bells, PA-C  [facility] [address] [place], [place] [address] [phone]

## 2022-11-25 ENCOUNTER — Encounter: Payer: Self-pay | Admitting: Urology

## 2022-11-25 ENCOUNTER — Ambulatory Visit: Payer: 59 | Admitting: Urology

## 2022-11-25 VITALS — BP 134/85 | HR 66

## 2022-11-25 DIAGNOSIS — N486 Induration penis plastica: Secondary | ICD-10-CM

## 2022-11-25 MED ORDER — COLLAGENASE CLOSTRID HISTOLYT 0.9 MG IJ SOLR
0.9000 mg | Freq: Once | INTRAMUSCULAR | Status: AC
Start: 2022-11-25 — End: 2022-11-25
  Administered 2022-11-25: 0.9 mg via INTRAMUSCULAR

## 2022-11-25 MED ORDER — PHENYLEPHRINE HCL (PRESSORS) 10 MG/ML IV SOLN
0.1000 mg | Freq: Once | INTRAVENOUS | Status: AC
Start: 2022-11-25 — End: 2022-11-25
  Administered 2022-11-25: 0.1 mg via INTRAMUSCULAR

## 2022-11-25 NOTE — Progress Notes (Unsigned)
Xiaflex injection:  Procedure:  Concentration and amount of PGE-1 injected: 0.5cc of 10g/CC  Approximate size and location of plaque: 1cm mid penile shaft  Degree of curvature: 70 degree dorsal  Neo-Synephrine 150 g brought brisk detumescence.  Injection procedure:  Using sterile technique an alcohol swab was used to clean the area of injection. The location had been previously marked. A 27-gauge needle was inserted through the skin and into the plaque at the point of maximum concavity with the needle oriented perpendicular to the corpus cavernosum. The needle was advanced transversely through the width of the plaque toward the opposite side of the plaque without passing completely through it. Proper position was confirmed by noting resistance to minimal depression of the syringe plunger. I then began injecting maintaining steady pressure as I withdrew the needle slowly through the transverse width of the plaque depositing the 80mg of Xiaflex solution 10mg wastage. The needle was then withdrawn and gentle pressure was applied at the injection site.    He tolerated his injection well. I will therefore have him return in 48 hours for repeat injection.  

## 2022-11-28 ENCOUNTER — Ambulatory Visit: Payer: 59 | Admitting: Urology

## 2022-11-28 ENCOUNTER — Encounter: Payer: Self-pay | Admitting: Urology

## 2022-11-28 VITALS — BP 144/81 | HR 80

## 2022-11-28 DIAGNOSIS — N486 Induration penis plastica: Secondary | ICD-10-CM | POA: Diagnosis not present

## 2022-11-28 MED ORDER — COLLAGENASE CLOSTRID HISTOLYT 0.9 MG IJ SOLR
0.9000 mg | Freq: Once | INTRAMUSCULAR | Status: AC
Start: 2022-11-28 — End: 2022-11-28
  Administered 2022-11-28: 0.9 mg via INTRAMUSCULAR

## 2022-11-28 NOTE — Progress Notes (Signed)
Xiaflex second injection:  Injection procedure:  Using sterile technique an alcohol swab was used to clean the area of injection. The location had been previously marked. A 27-gauge needle was inserted through the skin and into the plaque at the point of maximum concavity with the needle oriented perpendicular to the corpus cavernosum. The needle was advanced transversely through the width of the plaque toward the opposite side of the plaque without passing completely through it. Proper position was confirmed by noting resistance to minimal depression of the syringe plunger. I then began injecting maintaining steady pressure as I withdrew the needle slowly through the transverse width of the plaque depositing  0.25 cc  of Xiaflex solution 0cc wastage. The needle was then withdrawn and gentle pressure was applied at the injection site.   He tolerated his injection well. I will therefore have him return in 48 hours for penile modeling.  

## 2022-11-28 NOTE — Patient Instructions (Signed)
Collagenase Injection (Dupuytren Contracture/Peyronie Disease) What is this medication? COLLAGENASE (kohl LAH jen ace) treats conditions caused by thickening of tissue in your body. It works by breaking down excess collagen in the tissue, which reduces stiffness and tightness. This medicine may be used for other purposes; ask your health care provider or pharmacist if you have questions. COMMON BRAND NAME(S): Xiaflex What should I tell my care team before I take this medication? They need to know if you have any of these conditions: Bleeding disorder An unusual or allergic reaction to collagenase, other medications, foods, dyes, or preservatives Pregnant or trying to get pregnant Breast-feeding How should I use this medication? This medication is injected into the affected area. It is given by your care team in a hospital or clinic setting. A special MedGuide will be given to you by the pharmacist with each prescription and refill. Be sure to read this information carefully each time. Talk to your care team about the use of this medication in children. Special care may be needed. Overdosage: If you think you have taken too much of this medicine contact a poison control center or emergency room at once. NOTE: This medicine is only for you. Do not share this medicine with others. What if I miss a dose? Keep appointments for follow-up doses. It is important not to miss your dose. Call your care team if you are unable to keep an appointment. What may interact with this medication? Aspirin and aspirin-like medications Certain medications that treat or prevent blood clots, such as warfarin, enoxaparin, dalteparin, apixaban, dabigatran, rivaroxaban This list may not describe all possible interactions. Give your health care provider a list of all the medicines, herbs, non-prescription drugs, or dietary supplements you use. Also tell them if you smoke, drink alcohol, or use illegal drugs. Some items may  interact with your medicine. What should I watch for while using this medication? Your condition will be monitored carefully while you are receiving this medication. If medication is for Dupuytren's Contracture, visit your care team 1 to 3 days after the injection. Until you visit your care team, do not flex or extend the fingers of your hand that was injected. Do not touch your finger that was injected. Elevate your hand until bedtime. Do not perform activity with the injected hand until you are told that it is ok. Follow any instructions about wearing a splint or performing finger exercises. Contact your care team as soon as possible if you get increasing redness or swelling in the hand, have numbness or tingling in the treated finger, or have trouble bending the finger after the swelling goes down. If medication is for Peyronie's disease, do not have sex between the first and second injections. Wait 4 weeks after the second injection and when there is no more pain or swelling in the penis to have sex. Avoid using vacuum erection devices during treatment with this medication. Try to avoid straining stomach muscles such as during bowel movements. Your care team will give you instructions on how to perform modeling activities at home. Contact your care team as soon as possible if you have severe pain or swelling in the penis, severe purple bruising and swelling of the penis, trouble passing urine, blood in urine, popping or cracking sound form the penis, or sudden loss of ability to maintain an erection. What side effects may I notice from receiving this medication? Side effects that you should report to your care team as soon as possible: Allergic reactions--skin rash,   itching, hives, swelling of the face, lips, tongue, or throat Feeling faint or lightheaded Skin infection--skin redness, swelling, warmth, or pain Severe back pain, chest pain, headache, trouble breathing after injection Snap or pop that  you feel or hear, severe pain, numbness, swelling, or bruising of or trouble moving in area where injected Side effects that usually do not require medical attention (report to your care team if they continue or are bothersome): Pain, redness, or irritation at injection site This list may not describe all possible side effects. Call your doctor for medical advice about side effects. You may report side effects to FDA at 1-800-FDA-1088. Where should I keep my medication? This medication is given in a hospital or clinic. It will not be stored at home. NOTE: This sheet is a summary. It may not cover all possible information. If you have questions about this medicine, talk to your doctor, pharmacist, or health care provider.  2024 Elsevier/Gold Standard (2021-05-07 00:00:00)  

## 2022-11-29 ENCOUNTER — Encounter: Payer: Self-pay | Admitting: Urology

## 2022-11-29 NOTE — Patient Instructions (Signed)
Collagenase Injection (Dupuytren Contracture/Peyronie Disease) What is this medication? COLLAGENASE (kohl LAH jen ace) treats conditions caused by thickening of tissue in your body. It works by breaking down excess collagen in the tissue, which reduces stiffness and tightness. This medicine may be used for other purposes; ask your health care provider or pharmacist if you have questions. COMMON BRAND NAME(S): Xiaflex What should I tell my care team before I take this medication? They need to know if you have any of these conditions: Bleeding disorder An unusual or allergic reaction to collagenase, other medications, foods, dyes, or preservatives Pregnant or trying to get pregnant Breast-feeding How should I use this medication? This medication is injected into the affected area. It is given by your care team in a hospital or clinic setting. A special MedGuide will be given to you by the pharmacist with each prescription and refill. Be sure to read this information carefully each time. Talk to your care team about the use of this medication in children. Special care may be needed. Overdosage: If you think you have taken too much of this medicine contact a poison control center or emergency room at once. NOTE: This medicine is only for you. Do not share this medicine with others. What if I miss a dose? Keep appointments for follow-up doses. It is important not to miss your dose. Call your care team if you are unable to keep an appointment. What may interact with this medication? Aspirin and aspirin-like medications Certain medications that treat or prevent blood clots, such as warfarin, enoxaparin, dalteparin, apixaban, dabigatran, rivaroxaban This list may not describe all possible interactions. Give your health care provider a list of all the medicines, herbs, non-prescription drugs, or dietary supplements you use. Also tell them if you smoke, drink alcohol, or use illegal drugs. Some items may  interact with your medicine. What should I watch for while using this medication? Your condition will be monitored carefully while you are receiving this medication. If medication is for Dupuytren's Contracture, visit your care team 1 to 3 days after the injection. Until you visit your care team, do not flex or extend the fingers of your hand that was injected. Do not touch your finger that was injected. Elevate your hand until bedtime. Do not perform activity with the injected hand until you are told that it is ok. Follow any instructions about wearing a splint or performing finger exercises. Contact your care team as soon as possible if you get increasing redness or swelling in the hand, have numbness or tingling in the treated finger, or have trouble bending the finger after the swelling goes down. If medication is for Peyronie's disease, do not have sex between the first and second injections. Wait 4 weeks after the second injection and when there is no more pain or swelling in the penis to have sex. Avoid using vacuum erection devices during treatment with this medication. Try to avoid straining stomach muscles such as during bowel movements. Your care team will give you instructions on how to perform modeling activities at home. Contact your care team as soon as possible if you have severe pain or swelling in the penis, severe purple bruising and swelling of the penis, trouble passing urine, blood in urine, popping or cracking sound form the penis, or sudden loss of ability to maintain an erection. What side effects may I notice from receiving this medication? Side effects that you should report to your care team as soon as possible: Allergic reactions--skin rash,   itching, hives, swelling of the face, lips, tongue, or throat Feeling faint or lightheaded Skin infection--skin redness, swelling, warmth, or pain Severe back pain, chest pain, headache, trouble breathing after injection Snap or pop that  you feel or hear, severe pain, numbness, swelling, or bruising of or trouble moving in area where injected Side effects that usually do not require medical attention (report to your care team if they continue or are bothersome): Pain, redness, or irritation at injection site This list may not describe all possible side effects. Call your doctor for medical advice about side effects. You may report side effects to FDA at 1-800-FDA-1088. Where should I keep my medication? This medication is given in a hospital or clinic. It will not be stored at home. NOTE: This sheet is a summary. It may not cover all possible information. If you have questions about this medicine, talk to your doctor, pharmacist, or health care provider.  2024 Elsevier/Gold Standard (2021-05-07 00:00:00)  

## 2022-11-30 ENCOUNTER — Ambulatory Visit: Payer: 59 | Admitting: Urology

## 2022-12-05 ENCOUNTER — Ambulatory Visit (HOSPITAL_COMMUNITY)
Admission: RE | Admit: 2022-12-05 | Discharge: 2022-12-05 | Disposition: A | Discharge status: Home / Self Care | Payer: 59 | Source: Ambulatory Visit | Attending: Internal Medicine | Admitting: Internal Medicine

## 2022-12-05 DIAGNOSIS — R9431 Abnormal electrocardiogram [ECG] [EKG]: Secondary | ICD-10-CM | POA: Diagnosis not present

## 2022-12-05 DIAGNOSIS — I351 Nonrheumatic aortic (valve) insufficiency: Secondary | ICD-10-CM | POA: Diagnosis not present

## 2022-12-05 DIAGNOSIS — I10 Essential (primary) hypertension: Secondary | ICD-10-CM | POA: Insufficient documentation

## 2022-12-05 DIAGNOSIS — E782 Mixed hyperlipidemia: Secondary | ICD-10-CM | POA: Insufficient documentation

## 2022-12-05 DIAGNOSIS — I2510 Atherosclerotic heart disease of native coronary artery without angina pectoris: Secondary | ICD-10-CM | POA: Diagnosis not present

## 2022-12-05 DIAGNOSIS — E785 Hyperlipidemia, unspecified: Secondary | ICD-10-CM | POA: Diagnosis not present

## 2022-12-05 DIAGNOSIS — Z87891 Personal history of nicotine dependence: Secondary | ICD-10-CM | POA: Diagnosis not present

## 2022-12-05 DIAGNOSIS — I480 Paroxysmal atrial fibrillation: Secondary | ICD-10-CM

## 2022-12-05 LAB — ECHOCARDIOGRAM COMPLETE
AR max vel: 2.86 cm2
AV Area VTI: 2.81 cm2
AV Area mean vel: 2.4 cm2
AV Mean grad: 5 mmHg
AV Peak grad: 9.7 mmHg
Ao pk vel: 1.56 m/s
Area-P 1/2: 2.46 cm2
Calc EF: 63.8 %
P 1/2 time: 2101 msec
S' Lateral: 2.4 cm
Single Plane A2C EF: 67.2 %
Single Plane A4C EF: 60.5 %

## 2022-12-06 ENCOUNTER — Other Ambulatory Visit (HOSPITAL_COMMUNITY): Payer: Self-pay

## 2022-12-06 MED ORDER — FENOFIBRATE 160 MG PO TABS
160.0000 mg | ORAL_TABLET | Freq: Every day | ORAL | 0 refills | Status: DC
  Filled 2022-12-06: qty 90, 90d supply, fill #0

## 2022-12-14 ENCOUNTER — Other Ambulatory Visit (HOSPITAL_COMMUNITY): Payer: Self-pay

## 2022-12-16 ENCOUNTER — Other Ambulatory Visit (HOSPITAL_COMMUNITY): Payer: Self-pay

## 2022-12-19 ENCOUNTER — Encounter (HOSPITAL_COMMUNITY): Payer: Self-pay

## 2022-12-19 ENCOUNTER — Other Ambulatory Visit (HOSPITAL_COMMUNITY): Payer: Self-pay

## 2022-12-26 ENCOUNTER — Other Ambulatory Visit (HOSPITAL_COMMUNITY): Payer: Self-pay

## 2023-01-02 DIAGNOSIS — G4733 Obstructive sleep apnea (adult) (pediatric): Secondary | ICD-10-CM | POA: Diagnosis not present

## 2023-01-13 ENCOUNTER — Other Ambulatory Visit (HOSPITAL_COMMUNITY): Payer: Self-pay

## 2023-01-13 MED ORDER — OZEMPIC (0.25 OR 0.5 MG/DOSE) 2 MG/3ML ~~LOC~~ SOPN
0.5000 mg | PEN_INJECTOR | SUBCUTANEOUS | 1 refills | Status: DC
  Filled 2023-01-13: qty 9, 84d supply, fill #0

## 2023-01-16 ENCOUNTER — Other Ambulatory Visit (HOSPITAL_COMMUNITY): Payer: Self-pay

## 2023-01-17 ENCOUNTER — Other Ambulatory Visit (HOSPITAL_COMMUNITY): Payer: Self-pay

## 2023-01-18 ENCOUNTER — Other Ambulatory Visit (HOSPITAL_COMMUNITY): Payer: Self-pay

## 2023-01-18 ENCOUNTER — Other Ambulatory Visit: Payer: Self-pay

## 2023-01-19 ENCOUNTER — Other Ambulatory Visit: Payer: Self-pay

## 2023-01-20 ENCOUNTER — Telehealth: Payer: Self-pay

## 2023-01-20 ENCOUNTER — Other Ambulatory Visit (HOSPITAL_COMMUNITY): Payer: Self-pay

## 2023-01-20 ENCOUNTER — Other Ambulatory Visit: Payer: Self-pay

## 2023-01-20 NOTE — Telephone Encounter (Signed)
Patient called and confirmed 8/21 appointment. Patient made aware that Xiaflex was not in office. Patient states he had contacted [facility] but will call them again to confirm order.

## 2023-01-23 ENCOUNTER — Other Ambulatory Visit: Payer: Self-pay

## 2023-01-23 DIAGNOSIS — G4733 Obstructive sleep apnea (adult) (pediatric): Secondary | ICD-10-CM | POA: Diagnosis not present

## 2023-01-25 ENCOUNTER — Ambulatory Visit: Payer: 59 | Admitting: Urology

## 2023-01-25 VITALS — BP 146/82 | HR 79

## 2023-01-25 DIAGNOSIS — N486 Induration penis plastica: Secondary | ICD-10-CM

## 2023-01-25 MED ORDER — PHENYLEPHRINE 100 MCG/ML FOR PRIAPISM (OUTPATIENT ~~LOC~~ UROLOGY USE ONLY)
100.0000 ug | Freq: Once | INTRAMUSCULAR | Status: AC
Start: 2023-01-25 — End: 2023-01-25
  Administered 2023-01-25: 100 ug via INTRACAVERNOUS

## 2023-01-25 MED ORDER — COLLAGENASE CLOSTRID HISTOLYT 0.9 MG IJ SOLR
0.9000 mg | Freq: Once | INTRAMUSCULAR | Status: AC
Start: 2023-01-25 — End: 2023-01-25
  Administered 2023-01-25: 0.9 mg via INTRAMUSCULAR

## 2023-01-25 NOTE — Progress Notes (Signed)
Xiaflex injection:  Procedure:  Concentration and amount of PGE-1 injected: 1.0cc of 10g/CC  Approximate size and location of plaque:  1cm proximal dorsal penile shaft  Degree of curvature: 55 degree dorsal  Neo-Synephrine 150 g brought brisk detumescence.  Injection procedure:  Using sterile technique an alcohol swab was used to clean the area of injection. The location had been previously marked. A 27-gauge needle was inserted through the skin and into the plaque at the point of maximum concavity with the needle oriented perpendicular to the corpus cavernosum. The needle was advanced transversely through the width of the plaque toward the opposite side of the plaque without passing completely through it. Proper position was confirmed by noting resistance to minimal depression of the syringe plunger. I then began injecting maintaining steady pressure as I withdrew the needle slowly through the transverse width of the plaque depositing the 80mg  of Xiaflex solution 10mg  wastage. The needle was then withdrawn and gentle pressure was applied at the injection site.    He tolerated his injection well. I will therefore have him return in 48 hours for repeat injection.

## 2023-01-27 ENCOUNTER — Ambulatory Visit: Payer: 59 | Admitting: Urology

## 2023-01-27 VITALS — BP 122/77 | HR 68

## 2023-01-27 DIAGNOSIS — N486 Induration penis plastica: Secondary | ICD-10-CM

## 2023-01-27 MED ORDER — COLLAGENASE CLOSTRID HISTOLYT 0.9 MG IJ SOLR
0.9000 mg | Freq: Once | INTRAMUSCULAR | Status: AC
Start: 2023-01-27 — End: 2023-01-27
  Administered 2023-01-27: 0.9 mg via INTRAMUSCULAR

## 2023-01-31 NOTE — Patient Instructions (Signed)
Collagenase Injection (Dupuytren Contracture/Peyronie Disease) What is this medication? COLLAGENASE (kohl LAH jen ace) treats conditions caused by thickening of tissue in your body. It works by breaking down excess collagen in the tissue, which reduces stiffness and tightness. This medicine may be used for other purposes; ask your health care provider or pharmacist if you have questions. COMMON BRAND NAME(S): Xiaflex What should I tell my care team before I take this medication? They need to know if you have any of these conditions: Bleeding disorder An unusual or allergic reaction to collagenase, other medications, foods, dyes, or preservatives Pregnant or trying to get pregnant Breast-feeding How should I use this medication? This medication is injected into the affected area. It is given by your care team in a hospital or clinic setting. A special MedGuide will be given to you by the pharmacist with each prescription and refill. Be sure to read this information carefully each time. Talk to your care team about the use of this medication in children. Special care may be needed. Overdosage: If you think you have taken too much of this medicine contact a poison control center or emergency room at once. NOTE: This medicine is only for you. Do not share this medicine with others. What if I miss a dose? Keep appointments for follow-up doses. It is important not to miss your dose. Call your care team if you are unable to keep an appointment. What may interact with this medication? Aspirin and aspirin-like medications Certain medications that treat or prevent blood clots, such as warfarin, enoxaparin, dalteparin, apixaban, dabigatran, rivaroxaban This list may not describe all possible interactions. Give your health care provider a list of all the medicines, herbs, non-prescription drugs, or dietary supplements you use. Also tell them if you smoke, drink alcohol, or use illegal drugs. Some items may  interact with your medicine. What should I watch for while using this medication? Your condition will be monitored carefully while you are receiving this medication. If medication is for Dupuytren's Contracture, visit your care team 1 to 3 days after the injection. Until you visit your care team, do not flex or extend the fingers of your hand that was injected. Do not touch your finger that was injected. Elevate your hand until bedtime. Do not perform activity with the injected hand until you are told that it is ok. Follow any instructions about wearing a splint or performing finger exercises. Contact your care team as soon as possible if you get increasing redness or swelling in the hand, have numbness or tingling in the treated finger, or have trouble bending the finger after the swelling goes down. If medication is for Peyronie's disease, do not have sex between the first and second injections. Wait 4 weeks after the second injection and when there is no more pain or swelling in the penis to have sex. Avoid using vacuum erection devices during treatment with this medication. Try to avoid straining stomach muscles such as during bowel movements. Your care team will give you instructions on how to perform modeling activities at home. Contact your care team as soon as possible if you have severe pain or swelling in the penis, severe purple bruising and swelling of the penis, trouble passing urine, blood in urine, popping or cracking sound form the penis, or sudden loss of ability to maintain an erection. What side effects may I notice from receiving this medication? Side effects that you should report to your care team as soon as possible: Allergic reactions--skin rash,   itching, hives, swelling of the face, lips, tongue, or throat Feeling faint or lightheaded Skin infection--skin redness, swelling, warmth, or pain Severe back pain, chest pain, headache, trouble breathing after injection Snap or pop that  you feel or hear, severe pain, numbness, swelling, or bruising of or trouble moving in area where injected Side effects that usually do not require medical attention (report to your care team if they continue or are bothersome): Pain, redness, or irritation at injection site This list may not describe all possible side effects. Call your doctor for medical advice about side effects. You may report side effects to FDA at 1-800-FDA-1088. Where should I keep my medication? This medication is given in a hospital or clinic. It will not be stored at home. NOTE: This sheet is a summary. It may not cover all possible information. If you have questions about this medicine, talk to your doctor, pharmacist, or health care provider.  2024 Elsevier/Gold Standard (2021-05-07 00:00:00)  

## 2023-02-01 NOTE — Patient Instructions (Signed)
Collagenase Injection (Dupuytren Contracture/Peyronie Disease) What is this medication? COLLAGENASE (kohl LAH jen ace) treats conditions caused by thickening of tissue in your body. It works by breaking down excess collagen in the tissue, which reduces stiffness and tightness. This medicine may be used for other purposes; ask your health care provider or pharmacist if you have questions. COMMON BRAND NAME(S): Xiaflex What should I tell my care team before I take this medication? They need to know if you have any of these conditions: Bleeding disorder An unusual or allergic reaction to collagenase, other medications, foods, dyes, or preservatives Pregnant or trying to get pregnant Breast-feeding How should I use this medication? This medication is injected into the affected area. It is given by your care team in a hospital or clinic setting. A special MedGuide will be given to you by the pharmacist with each prescription and refill. Be sure to read this information carefully each time. Talk to your care team about the use of this medication in children. Special care may be needed. Overdosage: If you think you have taken too much of this medicine contact a poison control center or emergency room at once. NOTE: This medicine is only for you. Do not share this medicine with others. What if I miss a dose? Keep appointments for follow-up doses. It is important not to miss your dose. Call your care team if you are unable to keep an appointment. What may interact with this medication? Aspirin and aspirin-like medications Certain medications that treat or prevent blood clots, such as warfarin, enoxaparin, dalteparin, apixaban, dabigatran, rivaroxaban This list may not describe all possible interactions. Give your health care provider a list of all the medicines, herbs, non-prescription drugs, or dietary supplements you use. Also tell them if you smoke, drink alcohol, or use illegal drugs. Some items may  interact with your medicine. What should I watch for while using this medication? Your condition will be monitored carefully while you are receiving this medication. If medication is for Dupuytren's Contracture, visit your care team 1 to 3 days after the injection. Until you visit your care team, do not flex or extend the fingers of your hand that was injected. Do not touch your finger that was injected. Elevate your hand until bedtime. Do not perform activity with the injected hand until you are told that it is ok. Follow any instructions about wearing a splint or performing finger exercises. Contact your care team as soon as possible if you get increasing redness or swelling in the hand, have numbness or tingling in the treated finger, or have trouble bending the finger after the swelling goes down. If medication is for Peyronie's disease, do not have sex between the first and second injections. Wait 4 weeks after the second injection and when there is no more pain or swelling in the penis to have sex. Avoid using vacuum erection devices during treatment with this medication. Try to avoid straining stomach muscles such as during bowel movements. Your care team will give you instructions on how to perform modeling activities at home. Contact your care team as soon as possible if you have severe pain or swelling in the penis, severe purple bruising and swelling of the penis, trouble passing urine, blood in urine, popping or cracking sound form the penis, or sudden loss of ability to maintain an erection. What side effects may I notice from receiving this medication? Side effects that you should report to your care team as soon as possible: Allergic reactions--skin rash,   itching, hives, swelling of the face, lips, tongue, or throat Feeling faint or lightheaded Skin infection--skin redness, swelling, warmth, or pain Severe back pain, chest pain, headache, trouble breathing after injection Snap or pop that  you feel or hear, severe pain, numbness, swelling, or bruising of or trouble moving in area where injected Side effects that usually do not require medical attention (report to your care team if they continue or are bothersome): Pain, redness, or irritation at injection site This list may not describe all possible side effects. Call your doctor for medical advice about side effects. You may report side effects to FDA at 1-800-FDA-1088. Where should I keep my medication? This medication is given in a hospital or clinic. It will not be stored at home. NOTE: This sheet is a summary. It may not cover all possible information. If you have questions about this medicine, talk to your doctor, pharmacist, or health care provider.  2024 Elsevier/Gold Standard (2021-05-07 00:00:00)  

## 2023-02-01 NOTE — Progress Notes (Signed)
Xiaflex second injection:  Injection procedure:  Using sterile technique an alcohol swab was used to clean the area of injection. The location had been previously marked. A 27-gauge needle was inserted through the skin and into the plaque at the point of maximum concavity with the needle oriented perpendicular to the corpus cavernosum. The needle was advanced transversely through the width of the plaque toward the opposite side of the plaque without passing completely through it. Proper position was confirmed by noting resistance to minimal depression of the syringe plunger. I then began injecting maintaining steady pressure as I withdrew the needle slowly through the transverse width of the plaque depositing  0.25 cc  of Xiaflex solution 0cc wastage. The needle was then withdrawn and gentle pressure was applied at the injection site.   He tolerated his injection well. .  

## 2023-02-02 DIAGNOSIS — G4733 Obstructive sleep apnea (adult) (pediatric): Secondary | ICD-10-CM | POA: Diagnosis not present

## 2023-02-13 ENCOUNTER — Other Ambulatory Visit: Payer: Self-pay

## 2023-02-14 DIAGNOSIS — G4733 Obstructive sleep apnea (adult) (pediatric): Secondary | ICD-10-CM | POA: Diagnosis not present

## 2023-02-21 ENCOUNTER — Ambulatory Visit (HOSPITAL_COMMUNITY): Payer: 59 | Admitting: Internal Medicine

## 2023-02-25 ENCOUNTER — Encounter (HOSPITAL_COMMUNITY): Payer: Self-pay

## 2023-02-27 ENCOUNTER — Ambulatory Visit (HOSPITAL_COMMUNITY)
Admission: RE | Admit: 2023-02-27 | Discharge: 2023-02-27 | Disposition: A | Discharge status: Home / Self Care | Payer: 59 | Source: Ambulatory Visit | Attending: Internal Medicine | Admitting: Internal Medicine

## 2023-02-27 VITALS — BP 142/84 | HR 71 | Ht 72.0 in | Wt 255.8 lb

## 2023-02-27 DIAGNOSIS — K219 Gastro-esophageal reflux disease without esophagitis: Secondary | ICD-10-CM | POA: Insufficient documentation

## 2023-02-27 DIAGNOSIS — Z87891 Personal history of nicotine dependence: Secondary | ICD-10-CM | POA: Diagnosis not present

## 2023-02-27 DIAGNOSIS — I480 Paroxysmal atrial fibrillation: Secondary | ICD-10-CM | POA: Insufficient documentation

## 2023-02-27 DIAGNOSIS — R9431 Abnormal electrocardiogram [ECG] [EKG]: Secondary | ICD-10-CM | POA: Insufficient documentation

## 2023-02-27 NOTE — Progress Notes (Signed)
Primary Care Physician: Kirstie Peri, MD Primary Cardiologist: None Electrophysiologist: None     Referring Physician: Dr. Georgena Spurling Marc Black is a 47 y.o. male with a history of HLD, GERD, and history of tobacco use 20 years ago, and paroxysmal atrial fibrillation who presents for consultation in the [facility]. The patient was initially diagnosed with atrial fibrillation with RVR on 6/11 after presenting to ED with symptoms of palpitations and SOB. He was discharged on diltiazem 120 mg daily. He admits to normally consuming 6 cups of coffee and 2 diet mountain diet sodas daily. Patient is not on anticoagulation. He has a CHADS2VASC score of zero.  On evaluation today, he is currently in NSR. He felt abrupt onset of symptoms and the following day when he felt 100% improved and had converted to NSR. Total time of Afib period was less than 24 hours. He has not had symptoms since then. He has stopped his caffeine consumption completely and admits to having a headache. Sleep study 2 years ago with finding of mild OSA and insurance would not cover treatment due to his score being so low.   On follow up 02/27/23, he is currently in NSR. He has had no episodes of Afib since last office visit. He has substantially reduced his daily caffeine intake. He has stopped diltiazem after the prescription finished. He has been doing well without issue.    he has a BMI of Body mass index is 34.69 kg/m.Marland Kitchen Filed Weights   02/27/23 1313  Weight: 116 kg     Atrial Fibrillation Management history:  Previous antiarrhythmic drugs: None Previous cardioversions: None Previous ablations: None Anticoagulation history: None   ROS- All systems are reviewed and negative except as per the HPI above.  Physical Exam: BP (!) 142/84   Pulse 71   Ht 6' (1.829 m)   Wt 116 kg   BMI 34.69 kg/m   GEN- The patient is well appearing, alert and oriented x 3 today.   Neck - no JVD or  carotid bruit noted Lungs- Clear to ausculation bilaterally, normal work of breathing Heart- Regular rate and rhythm, no murmurs, rubs or gallops, PMI not laterally displaced Extremities- no clubbing, cyanosis, or edema Skin - no rash or ecchymosis noted  EKG today demonstrates  Vent. rate 71 BPM PR interval 198 ms QRS duration 98 ms QT/QTcB 384/417 ms P-R-T axes 58 8 68 Normal sinus rhythm with sinus arrhythmia Low voltage QRS Cannot rule out Inferior infarct , age undetermined Cannot rule out Anterior infarct , age undetermined Abnormal ECG When compared with ECG of 21-Nov-2022 08:56, PREVIOUS ECG IS PRESENT  Echo 12/05/22:  1. Left ventricular ejection fraction, by estimation, is 65 to 70%. The  left ventricle has normal function. The left ventricle has no regional  wall motion abnormalities. Left ventricular diastolic parameters were  normal. The average left ventricular  global longitudinal strain is -20.1 %.   2. Right ventricular systolic function is normal. The right ventricular  size is normal.   3. The mitral valve is normal in structure. Trivial mitral valve  regurgitation.   4. The aortic valve is normal in structure. Aortic valve regurgitation is  trivial.   5. The inferior vena cava is normal in size with greater than 50%  respiratory variability, suggesting right atrial pressure of 3 mmHg.    ASSESSMENT & PLAN CHA2DS2-VASc Score = 0  The patient's score is based upon: CHF History: 0 HTN  History: 0 Diabetes History: 0 Stroke History: 0 Vascular Disease History: 0 Age Score: 0 Gender Score: 0       ASSESSMENT AND PLAN: Paroxysmal Atrial Fibrillation (ICD10:  I48.0) The patient's CHA2DS2-VASc score is 0, indicating a 0.2% annual risk of stroke.    He is currently in NSR. He is doing well with no episodes of Afib since substantially decreasing his caffeine intake. After discussion, we will proceed with conservative observation at this time.  He does not  meet criteria for anticoagulation. He is not taking diltiazem.  Monitor heart rhythm at home for burden - wife has [employer] pixel watch.   Follow up Afib clinic prn.   Marc Bells, PA-C  Af[facility] [address] [place], [place] [address] [phone]

## 2023-03-05 DIAGNOSIS — G4733 Obstructive sleep apnea (adult) (pediatric): Secondary | ICD-10-CM | POA: Diagnosis not present

## 2023-03-09 ENCOUNTER — Other Ambulatory Visit: Payer: Self-pay

## 2023-03-10 ENCOUNTER — Other Ambulatory Visit: Payer: Self-pay

## 2023-03-10 NOTE — Progress Notes (Signed)
Xiaflex administered in office, opt out of clinical services.

## 2023-03-10 NOTE — Progress Notes (Signed)
Specialty Pharmacy Refill Coordination Note  Marc Black is a 47 y.o. male contacted today regarding refills of specialty medication(s) Collagenase Clostrid Histolyt   Patient requested Delivery   Delivery date: 03/22/23   Verified address: [facility] [address]   Medication will be filled on 03/21/23.

## 2023-03-11 ENCOUNTER — Other Ambulatory Visit (HOSPITAL_COMMUNITY): Payer: Self-pay

## 2023-03-14 ENCOUNTER — Other Ambulatory Visit: Payer: Self-pay

## 2023-03-14 ENCOUNTER — Other Ambulatory Visit (HOSPITAL_COMMUNITY): Payer: Self-pay

## 2023-03-15 ENCOUNTER — Other Ambulatory Visit (HOSPITAL_COMMUNITY): Payer: Self-pay

## 2023-03-15 MED ORDER — FENOFIBRATE 160 MG PO TABS
160.0000 mg | ORAL_TABLET | Freq: Every day | ORAL | 0 refills | Status: DC
  Filled 2023-03-15: qty 90, 90d supply, fill #0

## 2023-03-20 ENCOUNTER — Other Ambulatory Visit (HOSPITAL_COMMUNITY): Payer: Self-pay

## 2023-03-20 ENCOUNTER — Other Ambulatory Visit: Payer: Self-pay

## 2023-03-20 NOTE — Progress Notes (Signed)
REMS verified for Dr. Ronne Binning, ID is [phone].

## 2023-03-21 ENCOUNTER — Other Ambulatory Visit: Payer: Self-pay

## 2023-03-22 ENCOUNTER — Other Ambulatory Visit: Payer: Self-pay

## 2023-03-23 ENCOUNTER — Other Ambulatory Visit: Payer: Self-pay

## 2023-03-24 DIAGNOSIS — E118 Type 2 diabetes mellitus with unspecified complications: Secondary | ICD-10-CM | POA: Diagnosis not present

## 2023-03-24 DIAGNOSIS — E782 Mixed hyperlipidemia: Secondary | ICD-10-CM | POA: Diagnosis not present

## 2023-03-27 ENCOUNTER — Other Ambulatory Visit: Payer: Self-pay

## 2023-03-28 ENCOUNTER — Other Ambulatory Visit: Payer: Self-pay

## 2023-03-29 ENCOUNTER — Ambulatory Visit: Payer: 59 | Admitting: Urology

## 2023-03-29 VITALS — BP 137/68 | HR 80

## 2023-03-29 DIAGNOSIS — N486 Induration penis plastica: Secondary | ICD-10-CM | POA: Diagnosis not present

## 2023-03-29 MED ORDER — COLLAGENASE CLOSTRID HISTOLYT 0.9 MG IJ SOLR
0.9000 mg | Freq: Once | INTRAMUSCULAR | Status: AC
Start: 2023-03-29 — End: 2023-03-29
  Administered 2023-03-29: 0.9 mg via INTRAMUSCULAR

## 2023-03-29 MED ORDER — PHENYLEPHRINE 100 MCG/ML FOR PRIAPISM (OUTPATIENT ~~LOC~~ UROLOGY USE ONLY)
100.0000 ug | Freq: Once | INTRAMUSCULAR | Status: AC
Start: 2023-03-29 — End: 2023-03-29
  Administered 2023-03-29: 100 ug via INTRACAVERNOUS

## 2023-03-29 NOTE — Progress Notes (Unsigned)
Xiaflex injection:  Procedure:  Concentration and amount of PGE-1 injected: 1cc of 10g/CC  Approximate size and location of plaque:   Degree of curvature: 55 degree dorsal  Neo-Synephrine 150 g brought brisk detumescence.  Injection procedure:  Using sterile technique an alcohol swab was used to clean the area of injection. The location had been previously marked. A 27-gauge needle was inserted through the skin and into the plaque at the point of maximum concavity with the needle oriented perpendicular to the corpus cavernosum. The needle was advanced transversely through the width of the plaque toward the opposite side of the plaque without passing completely through it. Proper position was confirmed by noting resistance to minimal depression of the syringe plunger. I then began injecting maintaining steady pressure as I withdrew the needle slowly through the transverse width of the plaque depositing the 80mg  of Xiaflex solution 10mg  wastage. The needle was then withdrawn and gentle pressure was applied at the injection site.    He tolerated his injection well. I will therefore have him return in 48 hours for repeat injection.

## 2023-03-30 ENCOUNTER — Other Ambulatory Visit (HOSPITAL_COMMUNITY): Payer: Self-pay

## 2023-03-30 ENCOUNTER — Encounter: Payer: Self-pay | Admitting: Urology

## 2023-03-30 MED ORDER — PANTOPRAZOLE SODIUM 40 MG PO TBEC
40.0000 mg | DELAYED_RELEASE_TABLET | Freq: Every day | ORAL | 1 refills | Status: AC
  Filled 2023-03-30 – 2023-05-01 (×2): qty 90, 90d supply, fill #0
  Filled 2023-08-07: qty 90, 90d supply, fill #1

## 2023-03-30 NOTE — Patient Instructions (Signed)
Collagenase Injection (Dupuytren Contracture/Peyronie Disease) What is this medication? COLLAGENASE (kohl LAH jen ace) treats conditions caused by thickening of tissue in your body. It works by breaking down excess collagen in the tissue, which reduces stiffness and tightness. This medicine may be used for other purposes; ask your health care provider or pharmacist if you have questions. COMMON BRAND NAME(S): Xiaflex What should I tell my care team before I take this medication? They need to know if you have any of these conditions: Bleeding disorder An unusual or allergic reaction to collagenase, other medications, foods, dyes, or preservatives Pregnant or trying to get pregnant Breast-feeding How should I use this medication? This medication is injected into the affected area. It is given by your care team in a hospital or clinic setting. A special MedGuide will be given to you by the pharmacist with each prescription and refill. Be sure to read this information carefully each time. Talk to your care team about the use of this medication in children. Special care may be needed. Overdosage: If you think you have taken too much of this medicine contact a poison control center or emergency room at once. NOTE: This medicine is only for you. Do not share this medicine with others. What if I miss a dose? Keep appointments for follow-up doses. It is important not to miss your dose. Call your care team if you are unable to keep an appointment. What may interact with this medication? Aspirin and aspirin-like medications Certain medications that treat or prevent blood clots, such as warfarin, enoxaparin, dalteparin, apixaban, dabigatran, rivaroxaban This list may not describe all possible interactions. Give your health care provider a list of all the medicines, herbs, non-prescription drugs, or dietary supplements you use. Also tell them if you smoke, drink alcohol, or use illegal drugs. Some items may  interact with your medicine. What should I watch for while using this medication? Your condition will be monitored carefully while you are receiving this medication. If medication is for Dupuytren's Contracture, visit your care team 1 to 3 days after the injection. Until you visit your care team, do not flex or extend the fingers of your hand that was injected. Do not touch your finger that was injected. Elevate your hand until bedtime. Do not perform activity with the injected hand until you are told that it is ok. Follow any instructions about wearing a splint or performing finger exercises. Contact your care team as soon as possible if you get increasing redness or swelling in the hand, have numbness or tingling in the treated finger, or have trouble bending the finger after the swelling goes down. If medication is for Peyronie's disease, do not have sex between the first and second injections. Wait 4 weeks after the second injection and when there is no more pain or swelling in the penis to have sex. Avoid using vacuum erection devices during treatment with this medication. Try to avoid straining stomach muscles such as during bowel movements. Your care team will give you instructions on how to perform modeling activities at home. Contact your care team as soon as possible if you have severe pain or swelling in the penis, severe purple bruising and swelling of the penis, trouble passing urine, blood in urine, popping or cracking sound form the penis, or sudden loss of ability to maintain an erection. What side effects may I notice from receiving this medication? Side effects that you should report to your care team as soon as possible: Allergic reactions--skin rash,   itching, hives, swelling of the face, lips, tongue, or throat Feeling faint or lightheaded Skin infection--skin redness, swelling, warmth, or pain Severe back pain, chest pain, headache, trouble breathing after injection Snap or pop that  you feel or hear, severe pain, numbness, swelling, or bruising of or trouble moving in area where injected Side effects that usually do not require medical attention (report to your care team if they continue or are bothersome): Pain, redness, or irritation at injection site This list may not describe all possible side effects. Call your doctor for medical advice about side effects. You may report side effects to FDA at 1-800-FDA-1088. Where should I keep my medication? This medication is given in a hospital or clinic. It will not be stored at home. NOTE: This sheet is a summary. It may not cover all possible information. If you have questions about this medicine, talk to your doctor, pharmacist, or health care provider.  2024 Elsevier/Gold Standard (2021-05-07 00:00:00)  

## 2023-03-31 ENCOUNTER — Ambulatory Visit: Payer: 59 | Admitting: Urology

## 2023-03-31 VITALS — BP 128/85 | HR 78

## 2023-03-31 DIAGNOSIS — N486 Induration penis plastica: Secondary | ICD-10-CM | POA: Diagnosis not present

## 2023-03-31 MED ORDER — COLLAGENASE CLOSTRID HISTOLYT 0.9 MG IJ SOLR
0.9000 mg | Freq: Once | INTRAMUSCULAR | Status: AC
Start: 2023-03-31 — End: 2023-03-31
  Administered 2023-03-31: 0.9 mg via INTRAMUSCULAR

## 2023-03-31 NOTE — Progress Notes (Unsigned)
Xiaflex second injection:  Injection procedure:  Using sterile technique an alcohol swab was used to clean the area of injection. The location had been previously marked. A 27-gauge needle was inserted through the skin and into the plaque at the point of maximum concavity with the needle oriented perpendicular to the corpus cavernosum. The needle was advanced transversely through the width of the plaque toward the opposite side of the plaque without passing completely through it. Proper position was confirmed by noting resistance to minimal depression of the syringe plunger. I then began injecting maintaining steady pressure as I withdrew the needle slowly through the transverse width of the plaque depositing  0.25 cc  of Xiaflex solution 0cc wastage. The needle was then withdrawn and gentle pressure was applied at the injection site.   He tolerated his injection well. .  

## 2023-04-02 ENCOUNTER — Encounter: Payer: Self-pay | Admitting: Urology

## 2023-04-02 NOTE — Patient Instructions (Signed)
Collagenase Injection (Dupuytren Contracture/Peyronie Disease) What is this medication? COLLAGENASE (kohl LAH jen ace) treats conditions caused by thickening of tissue in your body. It works by breaking down excess collagen in the tissue, which reduces stiffness and tightness. This medicine may be used for other purposes; ask your health care provider or pharmacist if you have questions. COMMON BRAND NAME(S): Xiaflex What should I tell my care team before I take this medication? They need to know if you have any of these conditions: Bleeding disorder An unusual or allergic reaction to collagenase, other medications, foods, dyes, or preservatives Pregnant or trying to get pregnant Breast-feeding How should I use this medication? This medication is injected into the affected area. It is given by your care team in a hospital or clinic setting. A special MedGuide will be given to you by the pharmacist with each prescription and refill. Be sure to read this information carefully each time. Talk to your care team about the use of this medication in children. Special care may be needed. Overdosage: If you think you have taken too much of this medicine contact a poison control center or emergency room at once. NOTE: This medicine is only for you. Do not share this medicine with others. What if I miss a dose? Keep appointments for follow-up doses. It is important not to miss your dose. Call your care team if you are unable to keep an appointment. What may interact with this medication? Aspirin and aspirin-like medications Certain medications that treat or prevent blood clots, such as warfarin, enoxaparin, dalteparin, apixaban, dabigatran, rivaroxaban This list may not describe all possible interactions. Give your health care provider a list of all the medicines, herbs, non-prescription drugs, or dietary supplements you use. Also tell them if you smoke, drink alcohol, or use illegal drugs. Some items may  interact with your medicine. What should I watch for while using this medication? Your condition will be monitored carefully while you are receiving this medication. If medication is for Dupuytren's Contracture, visit your care team 1 to 3 days after the injection. Until you visit your care team, do not flex or extend the fingers of your hand that was injected. Do not touch your finger that was injected. Elevate your hand until bedtime. Do not perform activity with the injected hand until you are told that it is ok. Follow any instructions about wearing a splint or performing finger exercises. Contact your care team as soon as possible if you get increasing redness or swelling in the hand, have numbness or tingling in the treated finger, or have trouble bending the finger after the swelling goes down. If medication is for Peyronie's disease, do not have sex between the first and second injections. Wait 4 weeks after the second injection and when there is no more pain or swelling in the penis to have sex. Avoid using vacuum erection devices during treatment with this medication. Try to avoid straining stomach muscles such as during bowel movements. Your care team will give you instructions on how to perform modeling activities at home. Contact your care team as soon as possible if you have severe pain or swelling in the penis, severe purple bruising and swelling of the penis, trouble passing urine, blood in urine, popping or cracking sound form the penis, or sudden loss of ability to maintain an erection. What side effects may I notice from receiving this medication? Side effects that you should report to your care team as soon as possible: Allergic reactions--skin rash,   itching, hives, swelling of the face, lips, tongue, or throat Feeling faint or lightheaded Skin infection--skin redness, swelling, warmth, or pain Severe back pain, chest pain, headache, trouble breathing after injection Snap or pop that  you feel or hear, severe pain, numbness, swelling, or bruising of or trouble moving in area where injected Side effects that usually do not require medical attention (report to your care team if they continue or are bothersome): Pain, redness, or irritation at injection site This list may not describe all possible side effects. Call your doctor for medical advice about side effects. You may report side effects to FDA at 1-800-FDA-1088. Where should I keep my medication? This medication is given in a hospital or clinic. It will not be stored at home. NOTE: This sheet is a summary. It may not cover all possible information. If you have questions about this medicine, talk to your doctor, pharmacist, or health care provider.  2024 Elsevier/Gold Standard (2021-05-07 00:00:00)  

## 2023-04-03 DIAGNOSIS — G4733 Obstructive sleep apnea (adult) (pediatric): Secondary | ICD-10-CM | POA: Diagnosis not present

## 2023-04-27 ENCOUNTER — Other Ambulatory Visit (HOSPITAL_COMMUNITY): Payer: Self-pay

## 2023-04-27 ENCOUNTER — Other Ambulatory Visit: Payer: Self-pay

## 2023-04-27 MED ORDER — DAPAGLIFLOZIN PROPANEDIOL 10 MG PO TABS
10.0000 mg | ORAL_TABLET | Freq: Every day | ORAL | 3 refills | Status: DC
  Filled 2023-04-27: qty 30, 30d supply, fill #0
  Filled 2023-05-23: qty 30, 30d supply, fill #1
  Filled 2023-06-22: qty 30, 30d supply, fill #2
  Filled 2023-07-24: qty 30, 30d supply, fill #3

## 2023-04-28 ENCOUNTER — Other Ambulatory Visit (HOSPITAL_COMMUNITY): Payer: Self-pay

## 2023-04-28 MED ORDER — ROSUVASTATIN CALCIUM 5 MG PO TABS
5.0000 mg | ORAL_TABLET | Freq: Every day | ORAL | 0 refills | Status: DC
  Filled 2023-04-28: qty 90, 90d supply, fill #0

## 2023-05-01 ENCOUNTER — Other Ambulatory Visit (HOSPITAL_COMMUNITY): Payer: Self-pay

## 2023-05-03 ENCOUNTER — Other Ambulatory Visit: Payer: Self-pay

## 2023-05-12 ENCOUNTER — Ambulatory Visit: Payer: 59 | Admitting: Urology

## 2023-05-16 ENCOUNTER — Other Ambulatory Visit (HOSPITAL_COMMUNITY): Payer: Self-pay

## 2023-05-19 ENCOUNTER — Other Ambulatory Visit (HOSPITAL_COMMUNITY): Payer: Self-pay

## 2023-05-19 ENCOUNTER — Encounter (HOSPITAL_COMMUNITY): Payer: Self-pay

## 2023-05-19 ENCOUNTER — Other Ambulatory Visit: Payer: Self-pay

## 2023-06-08 ENCOUNTER — Other Ambulatory Visit (HOSPITAL_COMMUNITY): Payer: Self-pay

## 2023-06-09 ENCOUNTER — Other Ambulatory Visit (HOSPITAL_COMMUNITY): Payer: Self-pay

## 2023-06-09 MED ORDER — FENOFIBRATE 160 MG PO TABS
160.0000 mg | ORAL_TABLET | Freq: Every day | ORAL | 0 refills | Status: DC
  Filled 2023-06-09: qty 90, 90d supply, fill #0

## 2023-07-04 DIAGNOSIS — E118 Type 2 diabetes mellitus with unspecified complications: Secondary | ICD-10-CM | POA: Diagnosis not present

## 2023-07-04 DIAGNOSIS — E782 Mixed hyperlipidemia: Secondary | ICD-10-CM | POA: Diagnosis not present

## 2023-07-04 DIAGNOSIS — M109 Gout, unspecified: Secondary | ICD-10-CM | POA: Diagnosis not present

## 2023-07-07 ENCOUNTER — Other Ambulatory Visit: Payer: Self-pay

## 2023-07-10 ENCOUNTER — Other Ambulatory Visit (HOSPITAL_COMMUNITY): Payer: Self-pay

## 2023-07-10 MED ORDER — PANTOPRAZOLE SODIUM 40 MG PO TBEC
40.0000 mg | DELAYED_RELEASE_TABLET | Freq: Every day | ORAL | 1 refills | Status: AC
  Filled 2023-07-10: qty 100, 100d supply, fill #0
  Filled 2023-08-07 – 2023-11-19 (×2): qty 90, 90d supply, fill #0

## 2023-07-10 MED ORDER — CELECOXIB 200 MG PO CAPS
200.0000 mg | ORAL_CAPSULE | Freq: Every day | ORAL | 1 refills | Status: DC
  Filled 2023-07-10: qty 30, 30d supply, fill #0
  Filled 2023-08-07: qty 30, 30d supply, fill #1

## 2023-07-12 ENCOUNTER — Encounter (INDEPENDENT_AMBULATORY_CARE_PROVIDER_SITE_OTHER): Payer: Self-pay | Admitting: *Deleted

## 2023-07-12 ENCOUNTER — Ambulatory Visit: Payer: Commercial Managed Care - PPO | Admitting: Urology

## 2023-07-12 VITALS — BP 133/90 | HR 73

## 2023-07-12 DIAGNOSIS — N486 Induration penis plastica: Secondary | ICD-10-CM

## 2023-07-12 NOTE — Progress Notes (Signed)
 07/12/2023 3:13 PM   Marc Black 1976-01-30 [id number]  Referring provider: Maree Isles, MD [address] [place],  [place] [address]     HPI: Marc Black is a 47yo here for followup for peyronies disease. He notes improvement in his curvature since last visit. He is able to have intercourse. Penile pain resolved. Based on picture the patient continues to have 50 degree curvature which is significantly improved. He has a separate plaque at the base of his penis which is 1cm and palpable.    PMH: Past Medical History:  Diagnosis Date   GERD (gastroesophageal reflux disease)    Gout    High cholesterol    Obese     Surgical History: Past Surgical History:  Procedure Laterality Date   ANAL FISTULOTOMY     HERNIA REPAIR      Home Medications:  Allergies as of 07/12/2023       Reactions   Morphine And Codeine Other (See Comments)   Aggressive, out of mind    Levaquin [levofloxacin] Rash        Medication List        Accurate as of July 12, 2023  3:13 PM. If you have any questions, ask your nurse or doctor.          albuterol 108 (90 Base) MCG/ACT inhaler Commonly known as: VENTOLIN HFA 1-2 puffs every 4-6 hours as needed for shortness of breath/wheezing 1-2 puffs every 4-6 hours as needed for shortness of breath/wheezing   AMBULATORY NON FORMULARY MEDICATION Medication Name: Prostaglandin 10 mcg/ml What changed: additional instructions   celecoxib  200 MG capsule Commonly known as: CELEBREX  Take 1 capsule (200 mg total) by mouth daily.   Farxiga  10 MG Tabs tablet Generic drug: dapagliflozin  propanediol Take 1 tablet (10 mg total) by mouth daily.   fenofibrate  160 MG tablet Take 1 tablet (160 mg total) by mouth daily.   ibuprofen  200 MG tablet Commonly known as: ADVIL  Take 200 mg by mouth as needed.   MAGNESIUM PO Take 1 tablet by mouth every morning. Not sure the dosage   meclizine  25 MG tablet Commonly known as: ANTIVERT  Take 1 tablet (25 mg  total) by mouth daily. What changed:  when to take this reasons to take this   Melatonin 10 MG Caps Takes only as needed   OSTEO BI-FLEX ONE PER DAY PO Take 2 capsules by mouth daily.   Ozempic  (0.25 or 0.5 MG/DOSE) 2 MG/3ML Sopn Generic drug: Semaglutide (0.25 or 0.5MG /DOS) Inject 0.5 mg into the skin once a week.   pantoprazole  40 MG tablet Commonly known as: PROTONIX  Take 1 tablet (40 mg total) by mouth daily.   pantoprazole  40 MG tablet Commonly known as: PROTONIX  Take 1 tablet (40 mg total) by mouth daily.   POTASSIUM PO Take 1 tablet by mouth every morning. Not sure the dosage   rosuvastatin  5 MG tablet Commonly known as: CRESTOR  Take 1 tablet (5 mg total) by mouth daily.   Xiaflex  0.9 MG Solr Generic drug: Collagenase  Clostrid Histolyt Inject 1 as directed every other day. What changed: when to take this        Allergies:  Allergies  Allergen Reactions   Morphine And Codeine Other (See Comments)    Aggressive, out of mind    Levaquin [Levofloxacin] Rash    Family History: Family History  Problem Relation Age of Onset   Healthy Mother    Healthy Father     Social History:  reports that he has quit  smoking. He has quit using smokeless tobacco. He reports current alcohol use. He reports that he does not use drugs.  ROS: All other review of systems were reviewed and are negative except what is noted above in HPI  Physical Exam: BP (!) 133/90   Pulse 73   Constitutional:  Alert and oriented, No acute distress. HEENT: [place] AT, moist mucus membranes.  Trachea midline, no masses. Cardiovascular: No clubbing, cyanosis, or edema. Respiratory: Normal respiratory effort, no increased work of breathing. GI: Abdomen is soft, nontender, nondistended, no abdominal masses GU: No CVA tenderness.  Lymph: No cervical or inguinal lymphadenopathy. Skin: No rashes, bruises or suspicious lesions. Neurologic: Grossly intact, no focal deficits, moving all 4  extremities. Psychiatric: Normal mood and affect.  Laboratory Data: Lab Results  Component Value Date   WBC 5.3 11/15/2022   HGB 16.7 11/15/2022   HCT 45.8 11/15/2022   MCV 89.5 11/15/2022   PLT 190 11/15/2022    Lab Results  Component Value Date   CREATININE 1.35 (H) 11/15/2022    No results found for: PSA  No results found for: TESTOSTERONE  No results found for: HGBA1C  Urinalysis    Component Value Date/Time   COLORURINE YELLOW 08/21/2013 1905   APPEARANCEUR Clear 04/12/2022 1132   LABSPEC 1.029 08/21/2013 1905   PHURINE 5.5 08/21/2013 1905   GLUCOSEU Negative 04/12/2022 1132   HGBUR NEGATIVE 08/21/2013 1905   BILIRUBINUR Negative 04/12/2022 1132   KETONESUR NEGATIVE 08/21/2013 1905   PROTEINUR Negative 04/12/2022 1132   PROTEINUR NEGATIVE 08/21/2013 1905   UROBILINOGEN 0.2 08/21/2013 1905   NITRITE Negative 04/12/2022 1132   NITRITE NEGATIVE 08/21/2013 1905   LEUKOCYTESUR Negative 04/12/2022 1132    Lab Results  Component Value Date   LABMICR Comment 04/12/2022    Pertinent Imaging:  No results found for this or any previous visit.  No results found for this or any previous visit.  No results found for this or any previous visit.  No results found for this or any previous visit.  No results found for this or any previous visit.  No results found for this or any previous visit.  No results found for this or any previous visit.  No results found for this or any previous visit.   Assessment & Plan:    1. Peyronie disease (Primary) We discussed the management of peyronies disease including medical therapy, penile plication, verapamil therapy and xiaflex  therapy. After discussed the options the patient elects for xiaflex  therapy    No follow-ups on file.  Belvie Clara, MD  [facility] [place]

## 2023-07-13 ENCOUNTER — Other Ambulatory Visit: Payer: Self-pay

## 2023-07-13 ENCOUNTER — Encounter: Payer: Self-pay | Admitting: Urology

## 2023-07-13 MED ORDER — AMBULATORY NON FORMULARY MEDICATION: 3 refills | Status: AC

## 2023-07-14 ENCOUNTER — Other Ambulatory Visit (HOSPITAL_COMMUNITY): Payer: Self-pay

## 2023-07-18 ENCOUNTER — Encounter: Payer: Self-pay | Admitting: Urology

## 2023-07-18 NOTE — Patient Instructions (Signed)
 Collagenase Injection (Dupuytren Contracture/Peyronie Disease) What is this medication? COLLAGENASE (kohl LAH jen ace) treats conditions caused by thickening of tissue in your body. It works by breaking down excess collagen in the tissue, which reduces stiffness and tightness. This medicine may be used for other purposes; ask your health care provider or pharmacist if you have questions. COMMON BRAND NAME(S): Xiaflex What should I tell my care team before I take this medication? They need to know if you have any of these conditions: Bleeding disorder An unusual or allergic reaction to collagenase, other medications, foods, dyes, or preservatives Pregnant or trying to get pregnant Breast-feeding How should I use this medication? This medication is injected into the affected area. It is given by your care team in a hospital or clinic setting. A special MedGuide will be given to you by the pharmacist with each prescription and refill. Be sure to read this information carefully each time. Talk to your care team about the use of this medication in children. Special care may be needed. Overdosage: If you think you have taken too much of this medicine contact a poison control center or emergency room at once. NOTE: This medicine is only for you. Do not share this medicine with others. What if I miss a dose? Keep appointments for follow-up doses. It is important not to miss your dose. Call your care team if you are unable to keep an appointment. What may interact with this medication? Aspirin and aspirin-like medications Certain medications that treat or prevent blood clots, such as warfarin, enoxaparin, dalteparin, apixaban, dabigatran, rivaroxaban This list may not describe all possible interactions. Give your health care provider a list of all the medicines, herbs, non-prescription drugs, or dietary supplements you use. Also tell them if you smoke, drink alcohol, or use illegal drugs. Some items may  interact with your medicine. What should I watch for while using this medication? Your condition will be monitored carefully while you are receiving this medication. If medication is for Dupuytren's Contracture, visit your care team 1 to 3 days after the injection. Until you visit your care team, do not flex or extend the fingers of your hand that was injected. Do not touch your finger that was injected. Elevate your hand until bedtime. Do not perform activity with the injected hand until you are told that it is ok. Follow any instructions about wearing a splint or performing finger exercises. Contact your care team as soon as possible if you get increasing redness or swelling in the hand, have numbness or tingling in the treated finger, or have trouble bending the finger after the swelling goes down. If medication is for Peyronie's disease, do not have sex between the first and second injections. Wait 4 weeks after the second injection and when there is no more pain or swelling in the penis to have sex. Avoid using vacuum erection devices during treatment with this medication. Try to avoid straining stomach muscles such as during bowel movements. Your care team will give you instructions on how to perform modeling activities at home. Contact your care team as soon as possible if you have severe pain or swelling in the penis, severe purple bruising and swelling of the penis, trouble passing urine, blood in urine, popping or cracking sound form the penis, or sudden loss of ability to maintain an erection. What side effects may I notice from receiving this medication? Side effects that you should report to your care team as soon as possible: Allergic reactions--skin rash,  itching, hives, swelling of the face, lips, tongue, or throat Feeling faint or lightheaded Skin infection--skin redness, swelling, warmth, or pain Severe back pain, chest pain, headache, trouble breathing after injection Snap or pop that  you feel or hear, severe pain, numbness, swelling, or bruising of or trouble moving in area where injected Side effects that usually do not require medical attention (report to your care team if they continue or are bothersome): Pain, redness, or irritation at injection site This list may not describe all possible side effects. Call your doctor for medical advice about side effects. You may report side effects to FDA at 1-800-FDA-1088. Where should I keep my medication? This medication is given in a hospital or clinic. It will not be stored at home. NOTE: This sheet is a summary. It may not cover all possible information. If you have questions about this medicine, talk to your doctor, pharmacist, or health care provider.  2024 Elsevier/Gold Standard (2021-05-07 00:00:00)

## 2023-08-07 ENCOUNTER — Other Ambulatory Visit (HOSPITAL_COMMUNITY): Payer: Self-pay

## 2023-08-07 ENCOUNTER — Other Ambulatory Visit: Payer: Self-pay

## 2023-08-07 MED ORDER — ROSUVASTATIN CALCIUM 5 MG PO TABS
5.0000 mg | ORAL_TABLET | Freq: Every day | ORAL | 0 refills | Status: DC
  Filled 2023-08-07: qty 90, 90d supply, fill #0

## 2023-08-07 MED ORDER — FENOFIBRATE 160 MG PO TABS
160.0000 mg | ORAL_TABLET | Freq: Every day | ORAL | 0 refills | Status: DC
  Filled 2023-08-07 – 2023-09-10 (×2): qty 90, 90d supply, fill #0

## 2023-08-07 MED ORDER — DAPAGLIFLOZIN PROPANEDIOL 10 MG PO TABS
10.0000 mg | ORAL_TABLET | Freq: Every day | ORAL | 3 refills | Status: DC
  Filled 2023-08-07 – 2023-08-23 (×2): qty 30, 30d supply, fill #0
  Filled 2023-09-22: qty 30, 30d supply, fill #1
  Filled 2023-10-24: qty 30, 30d supply, fill #2
  Filled 2023-11-19: qty 30, 30d supply, fill #3

## 2023-08-23 ENCOUNTER — Other Ambulatory Visit: Payer: Self-pay

## 2023-08-23 ENCOUNTER — Other Ambulatory Visit (HOSPITAL_COMMUNITY): Payer: Self-pay

## 2023-09-10 ENCOUNTER — Other Ambulatory Visit (HOSPITAL_COMMUNITY): Payer: Self-pay

## 2023-09-11 ENCOUNTER — Other Ambulatory Visit (HOSPITAL_COMMUNITY): Payer: Self-pay

## 2023-09-22 ENCOUNTER — Other Ambulatory Visit (HOSPITAL_COMMUNITY): Payer: Self-pay

## 2023-09-25 ENCOUNTER — Other Ambulatory Visit (HOSPITAL_COMMUNITY): Payer: Self-pay

## 2023-09-25 ENCOUNTER — Other Ambulatory Visit: Payer: Self-pay

## 2023-09-25 MED ORDER — CELECOXIB 200 MG PO CAPS
200.0000 mg | ORAL_CAPSULE | Freq: Every day | ORAL | 2 refills | Status: DC
  Filled 2023-09-25: qty 30, 30d supply, fill #0
  Filled 2023-10-24: qty 30, 30d supply, fill #1
  Filled 2023-12-24: qty 30, 30d supply, fill #2

## 2023-10-03 ENCOUNTER — Other Ambulatory Visit: Payer: Self-pay | Admitting: Urology

## 2023-10-03 ENCOUNTER — Other Ambulatory Visit: Payer: Self-pay

## 2023-10-03 NOTE — Progress Notes (Signed)
 Specialty Pharmacy Refill Coordination Note  Marc Black is a 48 y.o. male contacted today regarding refills of specialty medication(s) Collagenase  Clostrid Histolyt   Patient requested Courier to Provider Office   Delivery date: 10/10/23   Verified address: [facility] [address] [place] [address]   Medication will be filled on 10/09/23.   This fill date is pending response to refill request from provider. Patient is aware and if they have not received fill by intended date they must follow up with pharmacy.

## 2023-10-04 ENCOUNTER — Other Ambulatory Visit: Payer: Self-pay | Admitting: Urology

## 2023-10-04 ENCOUNTER — Other Ambulatory Visit: Payer: Self-pay

## 2023-10-04 MED ORDER — XIAFLEX 0.9 MG IJ SOLR
1.0000 | INTRAMUSCULAR | 3 refills | Status: DC
  Filled 2023-10-04 – 2023-11-19 (×2): qty 2, 30d supply, fill #0

## 2023-10-05 ENCOUNTER — Telehealth: Payer: Self-pay

## 2023-10-05 ENCOUNTER — Other Ambulatory Visit: Payer: Self-pay

## 2023-10-05 NOTE — Telephone Encounter (Signed)
 Spoke the [facility] at [facility] Specialty pharmacy. She states that their REM certification will not let them ship medication directly to the office.  Patient called and made aware. Patient states he will call them to see if he will be able to have medication shipped to his home. Patient made aware that a new rx was sent to [facility] specialty pharmacy as well. Patient will call office back.

## 2023-10-05 NOTE — Progress Notes (Signed)
 Per Adelene Adolf we are no longer authorized to fill via REMS for Xiaflex . Cancelled scheduled fill for 5/5 and notified Hope Cobb, coordinator at [facility], that medical billing may need to be pursued. Disenrolled.

## 2023-10-09 ENCOUNTER — Telehealth: Payer: Self-pay

## 2023-10-09 NOTE — Telephone Encounter (Signed)
 Monica from [facility] specialty pharmacy called to confirmed delivery of patient Xiaflex  10/12/2023 single used vitals 2 units for 42 days and 50 syringes equal to 4 days. Signature required.

## 2023-10-11 DIAGNOSIS — N486 Induration penis plastica: Secondary | ICD-10-CM | POA: Diagnosis not present

## 2023-10-16 ENCOUNTER — Encounter: Payer: Self-pay | Admitting: Urology

## 2023-10-16 ENCOUNTER — Ambulatory Visit: Payer: Commercial Managed Care - PPO | Admitting: Urology

## 2023-10-16 VITALS — BP 134/84 | HR 65

## 2023-10-16 DIAGNOSIS — N486 Induration penis plastica: Secondary | ICD-10-CM | POA: Diagnosis not present

## 2023-10-16 MED ORDER — PHENYLEPHRINE 100 MCG/ML FOR PRIAPISM (OUTPATIENT ~~LOC~~ UROLOGY USE ONLY)
100.0000 ug | Freq: Once | INTRAMUSCULAR | Status: AC
Start: 2023-10-16 — End: 2023-10-16
  Administered 2023-10-16: 100 ug via INTRACAVERNOUS

## 2023-10-16 MED ORDER — COLLAGENASE CLOSTRID HISTOLYT 0.9 MG IJ SOLR
0.9000 mg | Freq: Once | INTRAMUSCULAR | Status: AC
Start: 2023-10-16 — End: 2023-10-16
  Administered 2023-10-16: 0.9 mg via INTRAMUSCULAR

## 2023-10-16 NOTE — Patient Instructions (Signed)
 Collagenase Injection (Dupuytren Contracture/Peyronie Disease) What is this medication? COLLAGENASE (kohl LAH jen ace) treats conditions caused by thickening of tissue in your body. It works by breaking down excess collagen in the tissue, which reduces stiffness and tightness. This medicine may be used for other purposes; ask your health care provider or pharmacist if you have questions. COMMON BRAND NAME(S): Xiaflex What should I tell my care team before I take this medication? They need to know if you have any of these conditions: Bleeding disorder An unusual or allergic reaction to collagenase, other medications, foods, dyes, or preservatives Pregnant or trying to get pregnant Breast-feeding How should I use this medication? This medication is injected into the affected area. It is given by your care team in a hospital or clinic setting. A special MedGuide will be given to you by the pharmacist with each prescription and refill. Be sure to read this information carefully each time. Talk to your care team about the use of this medication in children. Special care may be needed. Overdosage: If you think you have taken too much of this medicine contact a poison control center or emergency room at once. NOTE: This medicine is only for you. Do not share this medicine with others. What if I miss a dose? Keep appointments for follow-up doses. It is important not to miss your dose. Call your care team if you are unable to keep an appointment. What may interact with this medication? Aspirin and aspirin-like medications Certain medications that treat or prevent blood clots, such as warfarin, enoxaparin, dalteparin, apixaban, dabigatran, rivaroxaban This list may not describe all possible interactions. Give your health care provider a list of all the medicines, herbs, non-prescription drugs, or dietary supplements you use. Also tell them if you smoke, drink alcohol, or use illegal drugs. Some items may  interact with your medicine. What should I watch for while using this medication? Your condition will be monitored carefully while you are receiving this medication. If medication is for Dupuytren's Contracture, visit your care team 1 to 3 days after the injection. Until you visit your care team, do not flex or extend the fingers of your hand that was injected. Do not touch your finger that was injected. Elevate your hand until bedtime. Do not perform activity with the injected hand until you are told that it is ok. Follow any instructions about wearing a splint or performing finger exercises. Contact your care team as soon as possible if you get increasing redness or swelling in the hand, have numbness or tingling in the treated finger, or have trouble bending the finger after the swelling goes down. If medication is for Peyronie's disease, do not have sex between the first and second injections. Wait 4 weeks after the second injection and when there is no more pain or swelling in the penis to have sex. Avoid using vacuum erection devices during treatment with this medication. Try to avoid straining stomach muscles such as during bowel movements. Your care team will give you instructions on how to perform modeling activities at home. Contact your care team as soon as possible if you have severe pain or swelling in the penis, severe purple bruising and swelling of the penis, trouble passing urine, blood in urine, popping or cracking sound form the penis, or sudden loss of ability to maintain an erection. What side effects may I notice from receiving this medication? Side effects that you should report to your care team as soon as possible: Allergic reactions--skin rash,  itching, hives, swelling of the face, lips, tongue, or throat Feeling faint or lightheaded Skin infection--skin redness, swelling, warmth, or pain Severe back pain, chest pain, headache, trouble breathing after injection Snap or pop that  you feel or hear, severe pain, numbness, swelling, or bruising of or trouble moving in area where injected Side effects that usually do not require medical attention (report to your care team if they continue or are bothersome): Pain, redness, or irritation at injection site This list may not describe all possible side effects. Call your doctor for medical advice about side effects. You may report side effects to FDA at 1-800-FDA-1088. Where should I keep my medication? This medication is given in a hospital or clinic. It will not be stored at home. NOTE: This sheet is a summary. It may not cover all possible information. If you have questions about this medicine, talk to your doctor, pharmacist, or health care provider.  2024 Elsevier/Gold Standard (2021-05-07 00:00:00)

## 2023-10-16 NOTE — Progress Notes (Signed)
 Xiaflex  injection:  Procedure:  Concentration and amount of PGE-1 injected: 1cc of 10g/CC  Approximate size and location of plaque: palpable dorsal 1cm proximal penile shaft  Degree of curvature: 55 degree dorsal  Neo-Synephrine 150 g brought brisk detumescence.  Injection procedure:  Using sterile technique an alcohol swab was used to clean the area of injection. The location had been previously marked. A 27-gauge needle was inserted through the skin and into the plaque at the point of maximum concavity with the needle oriented perpendicular to the corpus cavernosum. The needle was advanced transversely through the width of the plaque toward the opposite side of the plaque without passing completely through it. Proper position was confirmed by noting resistance to minimal depression of the syringe plunger. I then began injecting maintaining steady pressure as I withdrew the needle slowly through the transverse width of the plaque depositing the 80mg  of Xiaflex  solution 10mg  wastage. The needle was then withdrawn and gentle pressure was applied at the injection site.    He tolerated his injection well. I will therefore have him return in 48 hours for repeat injection.

## 2023-10-18 ENCOUNTER — Ambulatory Visit: Payer: Commercial Managed Care - PPO | Admitting: Urology

## 2023-10-18 ENCOUNTER — Encounter: Payer: Self-pay | Admitting: Urology

## 2023-10-18 VITALS — BP 135/82 | HR 68

## 2023-10-18 DIAGNOSIS — N486 Induration penis plastica: Secondary | ICD-10-CM | POA: Diagnosis not present

## 2023-10-18 MED ORDER — COLLAGENASE CLOSTRID HISTOLYT 0.9 MG IJ SOLR
0.9000 mg | Freq: Once | INTRAMUSCULAR | Status: AC
Start: 2023-10-18 — End: 2023-10-18
  Administered 2023-10-18: 0.9 mg via INTRAMUSCULAR

## 2023-10-18 NOTE — Patient Instructions (Signed)
 Collagenase Injection (Dupuytren Contracture/Peyronie Disease) What is this medication? COLLAGENASE (kohl LAH jen ace) treats conditions caused by thickening of tissue in your body. It works by breaking down excess collagen in the tissue, which reduces stiffness and tightness. This medicine may be used for other purposes; ask your health care provider or pharmacist if you have questions. COMMON BRAND NAME(S): Xiaflex What should I tell my care team before I take this medication? They need to know if you have any of these conditions: Bleeding disorder An unusual or allergic reaction to collagenase, other medications, foods, dyes, or preservatives Pregnant or trying to get pregnant Breast-feeding How should I use this medication? This medication is injected into the affected area. It is given by your care team in a hospital or clinic setting. A special MedGuide will be given to you by the pharmacist with each prescription and refill. Be sure to read this information carefully each time. Talk to your care team about the use of this medication in children. Special care may be needed. Overdosage: If you think you have taken too much of this medicine contact a poison control center or emergency room at once. NOTE: This medicine is only for you. Do not share this medicine with others. What if I miss a dose? Keep appointments for follow-up doses. It is important not to miss your dose. Call your care team if you are unable to keep an appointment. What may interact with this medication? Aspirin and aspirin-like medications Certain medications that treat or prevent blood clots, such as warfarin, enoxaparin, dalteparin, apixaban, dabigatran, rivaroxaban This list may not describe all possible interactions. Give your health care provider a list of all the medicines, herbs, non-prescription drugs, or dietary supplements you use. Also tell them if you smoke, drink alcohol, or use illegal drugs. Some items may  interact with your medicine. What should I watch for while using this medication? Your condition will be monitored carefully while you are receiving this medication. If medication is for Dupuytren's Contracture, visit your care team 1 to 3 days after the injection. Until you visit your care team, do not flex or extend the fingers of your hand that was injected. Do not touch your finger that was injected. Elevate your hand until bedtime. Do not perform activity with the injected hand until you are told that it is ok. Follow any instructions about wearing a splint or performing finger exercises. Contact your care team as soon as possible if you get increasing redness or swelling in the hand, have numbness or tingling in the treated finger, or have trouble bending the finger after the swelling goes down. If medication is for Peyronie's disease, do not have sex between the first and second injections. Wait 4 weeks after the second injection and when there is no more pain or swelling in the penis to have sex. Avoid using vacuum erection devices during treatment with this medication. Try to avoid straining stomach muscles such as during bowel movements. Your care team will give you instructions on how to perform modeling activities at home. Contact your care team as soon as possible if you have severe pain or swelling in the penis, severe purple bruising and swelling of the penis, trouble passing urine, blood in urine, popping or cracking sound form the penis, or sudden loss of ability to maintain an erection. What side effects may I notice from receiving this medication? Side effects that you should report to your care team as soon as possible: Allergic reactions--skin rash,  itching, hives, swelling of the face, lips, tongue, or throat Feeling faint or lightheaded Skin infection--skin redness, swelling, warmth, or pain Severe back pain, chest pain, headache, trouble breathing after injection Snap or pop that  you feel or hear, severe pain, numbness, swelling, or bruising of or trouble moving in area where injected Side effects that usually do not require medical attention (report to your care team if they continue or are bothersome): Pain, redness, or irritation at injection site This list may not describe all possible side effects. Call your doctor for medical advice about side effects. You may report side effects to FDA at 1-800-FDA-1088. Where should I keep my medication? This medication is given in a hospital or clinic. It will not be stored at home. NOTE: This sheet is a summary. It may not cover all possible information. If you have questions about this medicine, talk to your doctor, pharmacist, or health care provider.  2024 Elsevier/Gold Standard (2021-05-07 00:00:00)

## 2023-10-18 NOTE — Progress Notes (Signed)
 Xiaflex second injection:  Injection procedure:  Using sterile technique an alcohol swab was used to clean the area of injection. The location had been previously marked. A 27-gauge needle was inserted through the skin and into the plaque at the point of maximum concavity with the needle oriented perpendicular to the corpus cavernosum. The needle was advanced transversely through the width of the plaque toward the opposite side of the plaque without passing completely through it. Proper position was confirmed by noting resistance to minimal depression of the syringe plunger. I then began injecting maintaining steady pressure as I withdrew the needle slowly through the transverse width of the plaque depositing  0.25 cc  of Xiaflex solution 0cc wastage. The needle was then withdrawn and gentle pressure was applied at the injection site.   He tolerated his injection well. I will therefore have him return in 48 hours for penile modeling.

## 2023-10-20 ENCOUNTER — Ambulatory Visit: Payer: Commercial Managed Care - PPO | Admitting: Urology

## 2023-10-20 ENCOUNTER — Encounter: Payer: Self-pay | Admitting: Urology

## 2023-10-20 VITALS — BP 132/82 | HR 61

## 2023-10-20 DIAGNOSIS — N486 Induration penis plastica: Secondary | ICD-10-CM

## 2023-10-20 NOTE — Patient Instructions (Signed)
 Collagenase Injection (Dupuytren Contracture/Peyronie Disease) What is this medication? COLLAGENASE (kohl LAH jen ace) treats conditions caused by thickening of tissue in your body. It works by breaking down excess collagen in the tissue, which reduces stiffness and tightness. This medicine may be used for other purposes; ask your health care provider or pharmacist if you have questions. COMMON BRAND NAME(S): Xiaflex What should I tell my care team before I take this medication? They need to know if you have any of these conditions: Bleeding disorder An unusual or allergic reaction to collagenase, other medications, foods, dyes, or preservatives Pregnant or trying to get pregnant Breast-feeding How should I use this medication? This medication is injected into the affected area. It is given by your care team in a hospital or clinic setting. A special MedGuide will be given to you by the pharmacist with each prescription and refill. Be sure to read this information carefully each time. Talk to your care team about the use of this medication in children. Special care may be needed. Overdosage: If you think you have taken too much of this medicine contact a poison control center or emergency room at once. NOTE: This medicine is only for you. Do not share this medicine with others. What if I miss a dose? Keep appointments for follow-up doses. It is important not to miss your dose. Call your care team if you are unable to keep an appointment. What may interact with this medication? Aspirin and aspirin-like medications Certain medications that treat or prevent blood clots, such as warfarin, enoxaparin, dalteparin, apixaban, dabigatran, rivaroxaban This list may not describe all possible interactions. Give your health care provider a list of all the medicines, herbs, non-prescription drugs, or dietary supplements you use. Also tell them if you smoke, drink alcohol, or use illegal drugs. Some items may  interact with your medicine. What should I watch for while using this medication? Your condition will be monitored carefully while you are receiving this medication. If medication is for Dupuytren's Contracture, visit your care team 1 to 3 days after the injection. Until you visit your care team, do not flex or extend the fingers of your hand that was injected. Do not touch your finger that was injected. Elevate your hand until bedtime. Do not perform activity with the injected hand until you are told that it is ok. Follow any instructions about wearing a splint or performing finger exercises. Contact your care team as soon as possible if you get increasing redness or swelling in the hand, have numbness or tingling in the treated finger, or have trouble bending the finger after the swelling goes down. If medication is for Peyronie's disease, do not have sex between the first and second injections. Wait 4 weeks after the second injection and when there is no more pain or swelling in the penis to have sex. Avoid using vacuum erection devices during treatment with this medication. Try to avoid straining stomach muscles such as during bowel movements. Your care team will give you instructions on how to perform modeling activities at home. Contact your care team as soon as possible if you have severe pain or swelling in the penis, severe purple bruising and swelling of the penis, trouble passing urine, blood in urine, popping or cracking sound form the penis, or sudden loss of ability to maintain an erection. What side effects may I notice from receiving this medication? Side effects that you should report to your care team as soon as possible: Allergic reactions--skin rash,  itching, hives, swelling of the face, lips, tongue, or throat Feeling faint or lightheaded Skin infection--skin redness, swelling, warmth, or pain Severe back pain, chest pain, headache, trouble breathing after injection Snap or pop that  you feel or hear, severe pain, numbness, swelling, or bruising of or trouble moving in area where injected Side effects that usually do not require medical attention (report to your care team if they continue or are bothersome): Pain, redness, or irritation at injection site This list may not describe all possible side effects. Call your doctor for medical advice about side effects. You may report side effects to FDA at 1-800-FDA-1088. Where should I keep my medication? This medication is given in a hospital or clinic. It will not be stored at home. NOTE: This sheet is a summary. It may not cover all possible information. If you have questions about this medicine, talk to your doctor, pharmacist, or health care provider.  2024 Elsevier/Gold Standard (2021-05-07 00:00:00)

## 2023-10-20 NOTE — Progress Notes (Signed)
 10/20/2023 9:42 AM   Marc Black [birth year] [id number]  Referring provider: Theoplis Fix, MD [address] [place],  [place] [address]  Followup peyronies disease   HPI: Mr Marc Black is a 48yo here for followup after xiaflex  injection. Mild bruising after last injection. No penile pain. Patient is here for penile modeling   PMH: Past Medical History:  Diagnosis Date   GERD (gastroesophageal reflux disease)    Gout    High cholesterol    Obese     Surgical History: Past Surgical History:  Procedure Laterality Date   ANAL FISTULOTOMY     HERNIA REPAIR      Home Medications:  Allergies as of 10/20/2023       Reactions   Morphine And Codeine Other (See Comments)   Aggressive, out of mind    Levaquin [levofloxacin] Rash        Medication List        Accurate as of Oct 20, 2023  9:42 AM. If you have any questions, ask your nurse or doctor.          albuterol 108 (90 Base) MCG/ACT inhaler Commonly known as: VENTOLIN HFA 1-2 puffs every 4-6 hours as needed for shortness of breath/wheezing 1-2 puffs every 4-6 hours as needed for shortness of breath/wheezing   AMBULATORY NON FORMULARY MEDICATION Medication Name: Prostaglandin 10 mcg/ml   celecoxib  200 MG capsule Commonly known as: CELEBREX  Take 1 capsule (200 mg total) by mouth daily.   Farxiga  10 MG Tabs tablet Generic drug: dapagliflozin  propanediol Take 1 tablet (10 mg total) by mouth daily.   fenofibrate  160 MG tablet Take 1 tablet (160 mg total) by mouth daily.   ibuprofen  200 MG tablet Commonly known as: ADVIL  Take 200 mg by mouth as needed.   MAGNESIUM PO Take 1 tablet by mouth every morning. Not sure the dosage   meclizine  25 MG tablet Commonly known as: ANTIVERT  Take 1 tablet (25 mg total) by mouth daily. What changed:  when to take this reasons to take this   Melatonin 10 MG Caps Takes only as needed   OSTEO BI-FLEX ONE PER DAY PO Take 2 capsules by mouth daily.   Ozempic  (0.25 or  0.5 MG/DOSE) 2 MG/3ML Sopn Generic drug: Semaglutide (0.25 or 0.5MG /DOS) Inject 0.5 mg into the skin once a week.   pantoprazole  40 MG tablet Commonly known as: PROTONIX  Take 1 tablet (40 mg total) by mouth daily.   pantoprazole  40 MG tablet Commonly known as: PROTONIX  Take 1 tablet (40 mg total) by mouth daily.   POTASSIUM PO Take 1 tablet by mouth every morning. Not sure the dosage   rosuvastatin  5 MG tablet Commonly known as: CRESTOR  Take 1 tablet (5 mg total) by mouth daily.   Xiaflex  0.9 MG Solr Generic drug: Collagenase  Clostrid Histolyt Inject 1 as directed every other day.        Allergies:  Allergies  Allergen Reactions   Morphine And Codeine Other (See Comments)    Aggressive, out of mind    Levaquin [Levofloxacin] Rash    Family History: Family History  Problem Relation Age of Onset   Healthy Mother    Healthy Father     Social History:  reports that he has quit smoking. He has quit using smokeless tobacco. He reports current alcohol use. He reports that he does not use drugs.  ROS: All other review of systems were reviewed and are negative except what is noted above in HPI  Physical Exam: BP 132/82  Pulse 61   Constitutional:  Alert and oriented, No acute distress. HEENT: [place] AT, moist mucus membranes.  Trachea midline, no masses. Cardiovascular: No clubbing, cyanosis, or edema. Respiratory: Normal respiratory effort, no increased work of breathing. GI: Abdomen is soft, nontender, nondistended, no abdominal masses GU: No CVA tenderness.  Lymph: No cervical or inguinal lymphadenopathy. Skin: No rashes, bruises or suspicious lesions. Neurologic: Grossly intact, no focal deficits, moving all 4 extremities. Psychiatric: Normal mood and affect.  Laboratory Data: Lab Results  Component Value Date   WBC 5.3 11/15/2022   HGB 16.7 11/15/2022   HCT 45.8 11/15/2022   MCV 89.5 11/15/2022   PLT 190 11/15/2022    Lab Results  Component Value Date    CREATININE 1.35 (H) 11/15/2022    No results found for: "PSA"  No results found for: "TESTOSTERONE"  No results found for: "HGBA1C"  Urinalysis    Component Value Date/Time   COLORURINE YELLOW 08/21/2013 1905   APPEARANCEUR Clear 04/12/2022 1132   LABSPEC 1.029 08/21/2013 1905   PHURINE 5.5 08/21/2013 1905   GLUCOSEU Negative 04/12/2022 1132   HGBUR NEGATIVE 08/21/2013 1905   BILIRUBINUR Negative 04/12/2022 1132   KETONESUR NEGATIVE 08/21/2013 1905   PROTEINUR Negative 04/12/2022 1132   PROTEINUR NEGATIVE 08/21/2013 1905   UROBILINOGEN 0.2 08/21/2013 1905   NITRITE Negative 04/12/2022 1132   NITRITE NEGATIVE 08/21/2013 1905   LEUKOCYTESUR Negative 04/12/2022 1132    Lab Results  Component Value Date   LABMICR Comment 04/12/2022    Pertinent Imaging: \ No results found for this or any previous visit.  No results found for this or any previous visit.  No results found for this or any previous visit.  No results found for this or any previous visit.  No results found for this or any previous visit.  No results found for this or any previous visit.  No results found for this or any previous visit.  No results found for this or any previous visit.   Assessment & Plan:    1. Peyronie disease (Primary) Penile modeling demonstrated with the patient today   No follow-ups on file.  Marc Nailer, MD  [facility] [place]

## 2023-10-24 ENCOUNTER — Other Ambulatory Visit (HOSPITAL_COMMUNITY): Payer: Self-pay

## 2023-10-25 ENCOUNTER — Other Ambulatory Visit: Payer: Self-pay

## 2023-10-25 ENCOUNTER — Other Ambulatory Visit (HOSPITAL_COMMUNITY): Payer: Self-pay

## 2023-10-25 MED ORDER — ROSUVASTATIN CALCIUM 5 MG PO TABS
5.0000 mg | ORAL_TABLET | Freq: Every day | ORAL | 0 refills | Status: DC
  Filled 2023-10-25: qty 90, 90d supply, fill #0

## 2023-10-25 MED ORDER — FENOFIBRATE 160 MG PO TABS
160.0000 mg | ORAL_TABLET | Freq: Every day | ORAL | 0 refills | Status: DC
  Filled 2023-10-25 – 2023-11-19 (×2): qty 90, 90d supply, fill #0

## 2023-11-19 ENCOUNTER — Other Ambulatory Visit (HOSPITAL_COMMUNITY): Payer: Self-pay

## 2023-11-20 ENCOUNTER — Other Ambulatory Visit: Payer: Self-pay

## 2023-11-20 ENCOUNTER — Telehealth: Payer: Self-pay

## 2023-11-20 ENCOUNTER — Other Ambulatory Visit (HOSPITAL_COMMUNITY): Payer: Self-pay

## 2023-11-20 DIAGNOSIS — N486 Induration penis plastica: Secondary | ICD-10-CM | POA: Diagnosis not present

## 2023-11-20 MED ORDER — ROSUVASTATIN CALCIUM 5 MG PO TABS
5.0000 mg | ORAL_TABLET | Freq: Every day | ORAL | 0 refills | Status: DC
  Filled 2023-11-20 – 2024-01-24 (×2): qty 90, 90d supply, fill #0

## 2023-11-20 NOTE — Telephone Encounter (Signed)
 Return call back [facility], spoke with Fabio Holts confirming patient Xiaflex  prescription  dated for November 21, 2023 morning  with signature required.

## 2023-11-20 NOTE — Progress Notes (Signed)
 Patient getting Xiaflex  from [facility]

## 2023-11-29 ENCOUNTER — Encounter: Payer: Self-pay | Admitting: Urology

## 2023-11-29 ENCOUNTER — Ambulatory Visit: Admitting: Urology

## 2023-11-29 VITALS — BP 143/84 | HR 74

## 2023-11-29 DIAGNOSIS — N486 Induration penis plastica: Secondary | ICD-10-CM | POA: Diagnosis not present

## 2023-11-29 MED ORDER — COLLAGENASE CLOSTRID HISTOLYT 0.9 MG IJ SOLR
0.9000 mg | Freq: Once | INTRAMUSCULAR | Status: AC
Start: 2023-11-29 — End: 2023-11-29
  Administered 2023-11-29: 0.9 mg via INTRAMUSCULAR

## 2023-11-29 MED ORDER — PHENYLEPHRINE 100 MCG/ML FOR PRIAPISM (OUTPATIENT ~~LOC~~ UROLOGY USE ONLY)
100.0000 ug | Freq: Once | INTRAMUSCULAR | Status: AC
Start: 2023-11-29 — End: 2023-11-29
  Administered 2023-11-29: 100 ug via INTRACAVERNOUS

## 2023-11-29 NOTE — Progress Notes (Signed)
 Xiaflex  injection:  Procedure:  Concentration and amount of PGE-1 injected: 1cc of 10g/CC  Approximate size and location of plaque: 0.5cm dorsal mid penile shaft palpable plaque  Degree of curvature:55 degree dorsal  Neo-Synephrine 150 g brought brisk detumescence.  Injection procedure:  Using sterile technique an alcohol swab was used to clean the area of injection. The location had been previously marked. A 27-gauge needle was inserted through the skin and into the plaque at the point of maximum concavity with the needle oriented perpendicular to the corpus cavernosum. The needle was advanced transversely through the width of the plaque toward the opposite side of the plaque without passing completely through it. Proper position was confirmed by noting resistance to minimal depression of the syringe plunger. I then began injecting maintaining steady pressure as I withdrew the needle slowly through the transverse width of the plaque depositing the 80mg  of Xiaflex  solution 10mg  wastage. The needle was then withdrawn and gentle pressure was applied at the injection site.    He tolerated his injection well. I will therefore have him return in 48 hours for repeat injection.

## 2023-11-29 NOTE — Patient Instructions (Signed)
 Collagenase Injection (Dupuytren Contracture/Peyronie Disease) What is this medication? COLLAGENASE (kohl LAH jen ace) treats conditions caused by thickening of tissue in your body. It works by breaking down excess collagen in the tissue, which reduces stiffness and tightness. This medicine may be used for other purposes; ask your health care provider or pharmacist if you have questions. COMMON BRAND NAME(S): Xiaflex What should I tell my care team before I take this medication? They need to know if you have any of these conditions: Bleeding disorder An unusual or allergic reaction to collagenase, other medications, foods, dyes, or preservatives Pregnant or trying to get pregnant Breast-feeding How should I use this medication? This medication is injected into the affected area. It is given by your care team in a hospital or clinic setting. A special MedGuide will be given to you by the pharmacist with each prescription and refill. Be sure to read this information carefully each time. Talk to your care team about the use of this medication in children. Special care may be needed. Overdosage: If you think you have taken too much of this medicine contact a poison control center or emergency room at once. NOTE: This medicine is only for you. Do not share this medicine with others. What if I miss a dose? Keep appointments for follow-up doses. It is important not to miss your dose. Call your care team if you are unable to keep an appointment. What may interact with this medication? Aspirin and aspirin-like medications Certain medications that treat or prevent blood clots, such as warfarin, enoxaparin, dalteparin, apixaban, dabigatran, rivaroxaban This list may not describe all possible interactions. Give your health care provider a list of all the medicines, herbs, non-prescription drugs, or dietary supplements you use. Also tell them if you smoke, drink alcohol, or use illegal drugs. Some items may  interact with your medicine. What should I watch for while using this medication? Your condition will be monitored carefully while you are receiving this medication. If medication is for Dupuytren's Contracture, visit your care team 1 to 3 days after the injection. Until you visit your care team, do not flex or extend the fingers of your hand that was injected. Do not touch your finger that was injected. Elevate your hand until bedtime. Do not perform activity with the injected hand until you are told that it is ok. Follow any instructions about wearing a splint or performing finger exercises. Contact your care team as soon as possible if you get increasing redness or swelling in the hand, have numbness or tingling in the treated finger, or have trouble bending the finger after the swelling goes down. If medication is for Peyronie's disease, do not have sex between the first and second injections. Wait 4 weeks after the second injection and when there is no more pain or swelling in the penis to have sex. Avoid using vacuum erection devices during treatment with this medication. Try to avoid straining stomach muscles such as during bowel movements. Your care team will give you instructions on how to perform modeling activities at home. Contact your care team as soon as possible if you have severe pain or swelling in the penis, severe purple bruising and swelling of the penis, trouble passing urine, blood in urine, popping or cracking sound form the penis, or sudden loss of ability to maintain an erection. What side effects may I notice from receiving this medication? Side effects that you should report to your care team as soon as possible: Allergic reactions--skin rash,  itching, hives, swelling of the face, lips, tongue, or throat Feeling faint or lightheaded Skin infection--skin redness, swelling, warmth, or pain Severe back pain, chest pain, headache, trouble breathing after injection Snap or pop that  you feel or hear, severe pain, numbness, swelling, or bruising of or trouble moving in area where injected Side effects that usually do not require medical attention (report to your care team if they continue or are bothersome): Pain, redness, or irritation at injection site This list may not describe all possible side effects. Call your doctor for medical advice about side effects. You may report side effects to FDA at 1-800-FDA-1088. Where should I keep my medication? This medication is given in a hospital or clinic. It will not be stored at home. NOTE: This sheet is a summary. It may not cover all possible information. If you have questions about this medicine, talk to your doctor, pharmacist, or health care provider.  2024 Elsevier/Gold Standard (2021-05-07 00:00:00)

## 2023-12-01 ENCOUNTER — Ambulatory Visit: Admitting: Urology

## 2023-12-01 ENCOUNTER — Encounter: Payer: Self-pay | Admitting: Urology

## 2023-12-01 VITALS — BP 118/74 | HR 63

## 2023-12-01 DIAGNOSIS — N486 Induration penis plastica: Secondary | ICD-10-CM

## 2023-12-01 MED ORDER — COLLAGENASE CLOSTRID HISTOLYT 0.9 MG IJ SOLR
0.9000 mg | Freq: Once | INTRAMUSCULAR | Status: AC
Start: 2023-12-01 — End: 2023-12-01
  Administered 2023-12-01: 0.9 mg via INTRAMUSCULAR

## 2023-12-01 NOTE — Progress Notes (Signed)
Xiaflex second injection:  Injection procedure:  Using sterile technique an alcohol swab was used to clean the area of injection. The location had been previously marked. A 27-gauge needle was inserted through the skin and into the plaque at the point of maximum concavity with the needle oriented perpendicular to the corpus cavernosum. The needle was advanced transversely through the width of the plaque toward the opposite side of the plaque without passing completely through it. Proper position was confirmed by noting resistance to minimal depression of the syringe plunger. I then began injecting maintaining steady pressure as I withdrew the needle slowly through the transverse width of the plaque depositing  0.25 cc  of Xiaflex solution 0cc wastage. The needle was then withdrawn and gentle pressure was applied at the injection site.   He tolerated his injection well. .  

## 2023-12-01 NOTE — Patient Instructions (Signed)
 Collagenase Injection (Dupuytren Contracture/Peyronie Disease) What is this medication? COLLAGENASE (kohl LAH jen ace) treats conditions caused by thickening of tissue in your body. It works by breaking down excess collagen in the tissue, which reduces stiffness and tightness. This medicine may be used for other purposes; ask your health care provider or pharmacist if you have questions. COMMON BRAND NAME(S): Xiaflex What should I tell my care team before I take this medication? They need to know if you have any of these conditions: Bleeding disorder An unusual or allergic reaction to collagenase, other medications, foods, dyes, or preservatives Pregnant or trying to get pregnant Breast-feeding How should I use this medication? This medication is injected into the affected area. It is given by your care team in a hospital or clinic setting. A special MedGuide will be given to you by the pharmacist with each prescription and refill. Be sure to read this information carefully each time. Talk to your care team about the use of this medication in children. Special care may be needed. Overdosage: If you think you have taken too much of this medicine contact a poison control center or emergency room at once. NOTE: This medicine is only for you. Do not share this medicine with others. What if I miss a dose? Keep appointments for follow-up doses. It is important not to miss your dose. Call your care team if you are unable to keep an appointment. What may interact with this medication? Aspirin and aspirin-like medications Certain medications that treat or prevent blood clots, such as warfarin, enoxaparin, dalteparin, apixaban, dabigatran, rivaroxaban This list may not describe all possible interactions. Give your health care provider a list of all the medicines, herbs, non-prescription drugs, or dietary supplements you use. Also tell them if you smoke, drink alcohol, or use illegal drugs. Some items may  interact with your medicine. What should I watch for while using this medication? Your condition will be monitored carefully while you are receiving this medication. If medication is for Dupuytren's Contracture, visit your care team 1 to 3 days after the injection. Until you visit your care team, do not flex or extend the fingers of your hand that was injected. Do not touch your finger that was injected. Elevate your hand until bedtime. Do not perform activity with the injected hand until you are told that it is ok. Follow any instructions about wearing a splint or performing finger exercises. Contact your care team as soon as possible if you get increasing redness or swelling in the hand, have numbness or tingling in the treated finger, or have trouble bending the finger after the swelling goes down. If medication is for Peyronie's disease, do not have sex between the first and second injections. Wait 4 weeks after the second injection and when there is no more pain or swelling in the penis to have sex. Avoid using vacuum erection devices during treatment with this medication. Try to avoid straining stomach muscles such as during bowel movements. Your care team will give you instructions on how to perform modeling activities at home. Contact your care team as soon as possible if you have severe pain or swelling in the penis, severe purple bruising and swelling of the penis, trouble passing urine, blood in urine, popping or cracking sound form the penis, or sudden loss of ability to maintain an erection. What side effects may I notice from receiving this medication? Side effects that you should report to your care team as soon as possible: Allergic reactions--skin rash,  itching, hives, swelling of the face, lips, tongue, or throat Feeling faint or lightheaded Skin infection--skin redness, swelling, warmth, or pain Severe back pain, chest pain, headache, trouble breathing after injection Snap or pop that  you feel or hear, severe pain, numbness, swelling, or bruising of or trouble moving in area where injected Side effects that usually do not require medical attention (report to your care team if they continue or are bothersome): Pain, redness, or irritation at injection site This list may not describe all possible side effects. Call your doctor for medical advice about side effects. You may report side effects to FDA at 1-800-FDA-1088. Where should I keep my medication? This medication is given in a hospital or clinic. It will not be stored at home. NOTE: This sheet is a summary. It may not cover all possible information. If you have questions about this medicine, talk to your doctor, pharmacist, or health care provider.  2024 Elsevier/Gold Standard (2021-05-07 00:00:00)

## 2023-12-24 ENCOUNTER — Other Ambulatory Visit (HOSPITAL_COMMUNITY): Payer: Self-pay

## 2023-12-25 ENCOUNTER — Other Ambulatory Visit: Payer: Self-pay

## 2023-12-25 ENCOUNTER — Other Ambulatory Visit (HOSPITAL_COMMUNITY): Payer: Self-pay

## 2023-12-25 MED ORDER — DAPAGLIFLOZIN PROPANEDIOL 10 MG PO TABS
10.0000 mg | ORAL_TABLET | Freq: Every day | ORAL | 3 refills | Status: DC
  Filled 2023-12-25: qty 30, 30d supply, fill #0
  Filled 2024-01-18: qty 30, 30d supply, fill #1
  Filled 2024-02-21: qty 30, 30d supply, fill #2
  Filled 2024-03-20: qty 30, 30d supply, fill #3

## 2024-01-03 DIAGNOSIS — E1169 Type 2 diabetes mellitus with other specified complication: Secondary | ICD-10-CM | POA: Diagnosis not present

## 2024-01-03 DIAGNOSIS — E118 Type 2 diabetes mellitus with unspecified complications: Secondary | ICD-10-CM | POA: Diagnosis not present

## 2024-01-03 DIAGNOSIS — E782 Mixed hyperlipidemia: Secondary | ICD-10-CM | POA: Diagnosis not present

## 2024-01-03 DIAGNOSIS — Z125 Encounter for screening for malignant neoplasm of prostate: Secondary | ICD-10-CM | POA: Diagnosis not present

## 2024-01-03 DIAGNOSIS — M109 Gout, unspecified: Secondary | ICD-10-CM | POA: Diagnosis not present

## 2024-01-08 ENCOUNTER — Other Ambulatory Visit (HOSPITAL_COMMUNITY): Payer: Self-pay

## 2024-01-08 MED ORDER — PANTOPRAZOLE SODIUM 40 MG PO TBEC
40.0000 mg | DELAYED_RELEASE_TABLET | Freq: Every day | ORAL | 1 refills | Status: AC
  Filled 2024-02-14: qty 90, 90d supply, fill #0

## 2024-01-08 MED ORDER — CELECOXIB 200 MG PO CAPS
200.0000 mg | ORAL_CAPSULE | Freq: Every day | ORAL | 1 refills | Status: DC
  Filled 2024-01-24: qty 30, 30d supply, fill #0
  Filled 2024-02-21: qty 30, 30d supply, fill #1

## 2024-01-09 ENCOUNTER — Encounter (INDEPENDENT_AMBULATORY_CARE_PROVIDER_SITE_OTHER): Payer: Self-pay | Admitting: *Deleted

## 2024-01-15 DIAGNOSIS — M25551 Pain in right hip: Secondary | ICD-10-CM | POA: Diagnosis not present

## 2024-01-15 DIAGNOSIS — M25552 Pain in left hip: Secondary | ICD-10-CM | POA: Diagnosis not present

## 2024-01-15 DIAGNOSIS — M5416 Radiculopathy, lumbar region: Secondary | ICD-10-CM | POA: Diagnosis not present

## 2024-01-18 ENCOUNTER — Other Ambulatory Visit: Payer: Self-pay

## 2024-01-25 ENCOUNTER — Other Ambulatory Visit: Payer: Self-pay

## 2024-01-25 ENCOUNTER — Other Ambulatory Visit (HOSPITAL_COMMUNITY): Payer: Self-pay

## 2024-01-30 DIAGNOSIS — M545 Low back pain, unspecified: Secondary | ICD-10-CM | POA: Diagnosis not present

## 2024-01-30 DIAGNOSIS — M7600 Gluteal tendinitis, unspecified hip: Secondary | ICD-10-CM | POA: Diagnosis not present

## 2024-01-31 ENCOUNTER — Ambulatory Visit: Admitting: Urology

## 2024-02-02 ENCOUNTER — Ambulatory Visit: Admitting: Urology

## 2024-02-08 ENCOUNTER — Telehealth: Payer: Self-pay | Admitting: Urology

## 2024-02-08 ENCOUNTER — Other Ambulatory Visit: Payer: Self-pay

## 2024-02-08 DIAGNOSIS — M7600 Gluteal tendinitis, unspecified hip: Secondary | ICD-10-CM | POA: Diagnosis not present

## 2024-02-08 DIAGNOSIS — M545 Low back pain, unspecified: Secondary | ICD-10-CM | POA: Diagnosis not present

## 2024-02-08 MED ORDER — XIAFLEX 0.9 MG IJ SOLR: 1.0000 | INTRAMUSCULAR | 3 refills | Status: AC

## 2024-02-08 NOTE — Telephone Encounter (Signed)
 Patient calling for refill on Xiaflex  to be sent in.  [facility]

## 2024-02-08 NOTE — Telephone Encounter (Signed)
 RX sent

## 2024-02-13 NOTE — Telephone Encounter (Signed)
 Patient waiting for insurance approval for Rx and pharmacy to get Rx in stock.  Please advise.  Call:  [phone]

## 2024-02-14 ENCOUNTER — Other Ambulatory Visit (HOSPITAL_COMMUNITY): Payer: Self-pay

## 2024-02-14 ENCOUNTER — Other Ambulatory Visit: Payer: Self-pay

## 2024-02-14 ENCOUNTER — Ambulatory Visit: Admitting: Urology

## 2024-02-14 NOTE — Telephone Encounter (Signed)
 Email sent to Marc Black from [employer] concerning patients Xiaflex .

## 2024-02-15 ENCOUNTER — Telehealth: Payer: Self-pay | Admitting: Urology

## 2024-02-15 DIAGNOSIS — M7600 Gluteal tendinitis, unspecified hip: Secondary | ICD-10-CM | POA: Diagnosis not present

## 2024-02-15 DIAGNOSIS — M545 Low back pain, unspecified: Secondary | ICD-10-CM | POA: Diagnosis not present

## 2024-02-15 NOTE — Telephone Encounter (Signed)
 Call [phone] or fax [phone] needs notes on clinicals and curvature of penis

## 2024-02-16 ENCOUNTER — Ambulatory Visit: Admitting: Urology

## 2024-02-19 NOTE — Telephone Encounter (Signed)
 Clinical documents noting curvature were faxed per request. Release: [id number]

## 2024-02-20 DIAGNOSIS — N486 Induration penis plastica: Secondary | ICD-10-CM | POA: Diagnosis not present

## 2024-02-21 ENCOUNTER — Other Ambulatory Visit (HOSPITAL_COMMUNITY): Payer: Self-pay

## 2024-02-23 ENCOUNTER — Telehealth: Payer: Self-pay | Admitting: Urology

## 2024-02-23 NOTE — Telephone Encounter (Signed)
 error

## 2024-02-27 DIAGNOSIS — M7600 Gluteal tendinitis, unspecified hip: Secondary | ICD-10-CM | POA: Diagnosis not present

## 2024-02-27 DIAGNOSIS — M545 Low back pain, unspecified: Secondary | ICD-10-CM | POA: Diagnosis not present

## 2024-03-02 ENCOUNTER — Other Ambulatory Visit (HOSPITAL_COMMUNITY): Payer: Self-pay

## 2024-03-04 ENCOUNTER — Other Ambulatory Visit (HOSPITAL_COMMUNITY): Payer: Self-pay

## 2024-03-04 ENCOUNTER — Other Ambulatory Visit: Payer: Self-pay

## 2024-03-04 MED ORDER — FENOFIBRATE 160 MG PO TABS
160.0000 mg | ORAL_TABLET | Freq: Every day | ORAL | 0 refills | Status: DC
  Filled 2024-03-04: qty 90, 90d supply, fill #0

## 2024-03-11 DIAGNOSIS — M545 Low back pain, unspecified: Secondary | ICD-10-CM | POA: Diagnosis not present

## 2024-03-20 ENCOUNTER — Other Ambulatory Visit (HOSPITAL_COMMUNITY): Payer: Self-pay

## 2024-04-01 ENCOUNTER — Other Ambulatory Visit (HOSPITAL_COMMUNITY): Payer: Self-pay

## 2024-04-01 MED ORDER — METFORMIN HCL 500 MG PO TABS
500.0000 mg | ORAL_TABLET | Freq: Two times a day (BID) | ORAL | 1 refills | Status: AC
  Filled 2024-04-01: qty 180, 90d supply, fill #0
  Filled 2024-06-30: qty 180, 90d supply, fill #1

## 2024-04-09 ENCOUNTER — Other Ambulatory Visit (HOSPITAL_COMMUNITY): Payer: Self-pay

## 2024-04-10 ENCOUNTER — Other Ambulatory Visit (HOSPITAL_COMMUNITY): Payer: Self-pay | Admitting: *Deleted

## 2024-04-10 ENCOUNTER — Other Ambulatory Visit (HOSPITAL_COMMUNITY): Payer: Self-pay

## 2024-04-10 ENCOUNTER — Telehealth (HOSPITAL_COMMUNITY): Payer: Self-pay | Admitting: *Deleted

## 2024-04-10 ENCOUNTER — Other Ambulatory Visit: Payer: Self-pay

## 2024-04-10 MED ORDER — CELECOXIB 200 MG PO CAPS
200.0000 mg | ORAL_CAPSULE | Freq: Every day | ORAL | 1 refills | Status: DC
  Filled 2024-04-10: qty 30, 30d supply, fill #0
  Filled 2024-05-05: qty 30, 30d supply, fill #1

## 2024-04-10 MED ORDER — DILTIAZEM HCL ER COATED BEADS 120 MG PO CP24
120.0000 mg | ORAL_CAPSULE | Freq: Every day | ORAL | 0 refills | Status: DC
  Filled 2024-04-10 (×2): qty 90, 90d supply, fill #0

## 2024-04-10 NOTE — Telephone Encounter (Signed)
 Patient called and reported back in Afib today per watch device. Pt requesting to start to be back on Diltiazem  120 daily he was previously taken and stopped. Spoke with DOROTHA Heinrich P.A. he agrees. Pt instructed to continue monitor HR. Follow appointment made.

## 2024-04-11 ENCOUNTER — Ambulatory Visit (HOSPITAL_COMMUNITY): Admitting: Internal Medicine

## 2024-04-16 ENCOUNTER — Ambulatory Visit (HOSPITAL_COMMUNITY)
Admission: RE | Admit: 2024-04-16 | Discharge: 2024-04-16 | Disposition: A | Discharge status: Home / Self Care | Source: Ambulatory Visit | Attending: Internal Medicine | Admitting: Internal Medicine

## 2024-04-16 ENCOUNTER — Other Ambulatory Visit (HOSPITAL_COMMUNITY): Payer: Self-pay

## 2024-04-16 ENCOUNTER — Encounter (HOSPITAL_COMMUNITY): Payer: Self-pay | Admitting: Internal Medicine

## 2024-04-16 VITALS — BP 142/96 | HR 68 | Ht 72.0 in | Wt 243.8 lb

## 2024-04-16 DIAGNOSIS — I480 Paroxysmal atrial fibrillation: Secondary | ICD-10-CM

## 2024-04-16 MED ORDER — DILTIAZEM HCL ER COATED BEADS 120 MG PO CP24
120.0000 mg | ORAL_CAPSULE | Freq: Every day | ORAL | 3 refills | Status: AC
  Filled 2024-04-16 – 2024-07-09 (×2): qty 90, 90d supply, fill #0

## 2024-04-16 NOTE — Progress Notes (Signed)
 Primary Care Physician: Maree Isles, MD Primary Cardiologist: None Electrophysiologist: None     Referring Physician: Dr. Patt Kiki Constant is a 48 y.o. male with a history of HLD, GERD, and history of tobacco use 20 years ago, and paroxysmal atrial fibrillation who presents for consultation in the [facility]. The patient was initially diagnosed with atrial fibrillation with RVR on 6/11 after presenting to ED with symptoms of palpitations and SOB. He was discharged on diltiazem  120 mg daily. He admits to normally consuming 6 cups of coffee and 2 diet mountain diet sodas daily. Patient is not on anticoagulation. He has a CHADS2VASC score of zero.  On follow up 04/10/24, patient is currently in NSR. He contacted office on 11/5 noting back in Afib. Restarted diltiazem  120 mg daily. He has noted improvement in the past 2 days. He notes palpitations and SOB when out of rhythm.  Today, he denies symptoms of chest pain, orthopnea, PND, lower extremity edema, dizziness, presyncope, syncope, snoring, daytime somnolence, bleeding, or neurologic sequela. The patient is tolerating medications without difficulties and is otherwise without complaint today.     he has a BMI of Body mass index is 33.07 kg/m.SABRA Filed Weights   04/16/24 1022  Weight: 110.6 kg      Atrial Fibrillation Management history:  Previous antiarrhythmic drugs: None Previous cardioversions: None Previous ablations: None Anticoagulation history: None   ROS- All systems are reviewed and negative except as per the HPI above.  Physical Exam: BP (!) 142/96   Pulse 68   Ht 6' (1.829 m)   Wt 110.6 kg   BMI 33.07 kg/m   GEN- The patient is well appearing, alert and oriented x 3 today.   Neck - no JVD or carotid bruit noted Lungs- Clear to ausculation bilaterally, normal work of breathing Heart- Regular rate and rhythm, no murmurs, rubs or gallops, PMI not laterally  displaced Extremities- no clubbing, cyanosis, or edema Skin - no rash or ecchymosis noted   EKG today demonstrates  Vent. rate 68 BPM PR interval 192 ms QRS duration 98 ms QT/QTcB 388/412 ms P-R-T axes 61 10 64 Normal sinus rhythm Septal infarct (cited on or before 21-Nov-2022) Abnormal ECG When compared with ECG of 27-Feb-2023 13:27, No significant change was found  Echo 12/05/22:  1. Left ventricular ejection fraction, by estimation, is 65 to 70%. The  left ventricle has normal function. The left ventricle has no regional  wall motion abnormalities. Left ventricular diastolic parameters were  normal. The average left ventricular  global longitudinal strain is -20.1 %.   2. Right ventricular systolic function is normal. The right ventricular  size is normal.   3. The mitral valve is normal in structure. Trivial mitral valve  regurgitation.   4. The aortic valve is normal in structure. Aortic valve regurgitation is  trivial.   5. The inferior vena cava is normal in size with greater than 50%  respiratory variability, suggesting right atrial pressure of 3 mmHg.   CHA2DS2-VASc Score = 0  The patient's score is based upon: CHF History: 0 HTN History: 0 Diabetes History: 0 Stroke History: 0 Vascular Disease History: 0 Age Score: 0 Gender Score: 0      ASSESSMENT AND PLAN: Paroxysmal Atrial Fibrillation (ICD10:  I48.0) The patient's CHA2DS2-VASc score is 0, indicating a 0.2% annual risk of stroke.    Patient is currently in NSR. After discussion, will continue diltiazem  120 mg daily. If he notes  recurrent fluttering, then can increase to diltiazem  180 mg daily. We discussed rhythm control options such as AAD therapy and/or ablation going forward.    Follow up 6 months Afib clinic.    Terra Pac, PA-C  Af[facility] Clinic [facility] [address] [place], [place] [address] [phone]

## 2024-04-26 ENCOUNTER — Other Ambulatory Visit (HOSPITAL_COMMUNITY): Payer: Self-pay

## 2024-04-26 ENCOUNTER — Other Ambulatory Visit: Payer: Self-pay

## 2024-04-26 MED ORDER — ROSUVASTATIN CALCIUM 5 MG PO TABS
5.0000 mg | ORAL_TABLET | Freq: Every day | ORAL | 0 refills | Status: DC
  Filled 2024-04-26: qty 90, 90d supply, fill #0

## 2024-04-29 ENCOUNTER — Other Ambulatory Visit (HOSPITAL_COMMUNITY): Payer: Self-pay

## 2024-04-30 ENCOUNTER — Other Ambulatory Visit: Payer: Self-pay

## 2024-04-30 ENCOUNTER — Other Ambulatory Visit (HOSPITAL_COMMUNITY): Payer: Self-pay

## 2024-04-30 MED ORDER — DAPAGLIFLOZIN PROPANEDIOL 10 MG PO TABS
10.0000 mg | ORAL_TABLET | Freq: Every day | ORAL | 3 refills | Status: AC
  Filled 2024-04-30 (×2): qty 30, 30d supply, fill #0
  Filled 2024-05-03 – 2024-05-28 (×3): qty 30, 30d supply, fill #1
  Filled 2024-06-25: qty 30, 30d supply, fill #2

## 2024-05-01 ENCOUNTER — Other Ambulatory Visit: Payer: Self-pay

## 2024-05-01 DIAGNOSIS — E782 Mixed hyperlipidemia: Secondary | ICD-10-CM | POA: Diagnosis not present

## 2024-05-01 DIAGNOSIS — M109 Gout, unspecified: Secondary | ICD-10-CM | POA: Diagnosis not present

## 2024-05-01 DIAGNOSIS — E1169 Type 2 diabetes mellitus with other specified complication: Secondary | ICD-10-CM | POA: Diagnosis not present

## 2024-05-03 ENCOUNTER — Other Ambulatory Visit (HOSPITAL_COMMUNITY): Payer: Self-pay

## 2024-05-03 ENCOUNTER — Other Ambulatory Visit: Payer: Self-pay

## 2024-05-05 ENCOUNTER — Other Ambulatory Visit (HOSPITAL_COMMUNITY): Payer: Self-pay

## 2024-05-06 ENCOUNTER — Other Ambulatory Visit (HOSPITAL_COMMUNITY): Payer: Self-pay

## 2024-05-06 MED ORDER — ROSUVASTATIN CALCIUM 5 MG PO TABS: 5.0000 mg | ORAL_TABLET | Freq: Every day | ORAL | 3 refills | Status: AC

## 2024-05-07 ENCOUNTER — Other Ambulatory Visit (HOSPITAL_COMMUNITY): Payer: Self-pay

## 2024-05-09 ENCOUNTER — Other Ambulatory Visit (HOSPITAL_COMMUNITY): Payer: Self-pay

## 2024-05-09 MED ORDER — PANTOPRAZOLE SODIUM 40 MG PO TBEC
40.0000 mg | DELAYED_RELEASE_TABLET | Freq: Every day | ORAL | 1 refills | Status: AC
  Filled 2024-05-09: qty 90, 90d supply, fill #0

## 2024-05-09 MED ORDER — ROSUVASTATIN CALCIUM 10 MG PO TABS
10.0000 mg | ORAL_TABLET | Freq: Every day | ORAL | 2 refills | Status: DC
  Filled 2024-05-09: qty 30, 30d supply, fill #0
  Filled 2024-06-05: qty 30, 30d supply, fill #1
  Filled 2024-07-09: qty 30, 30d supply, fill #2

## 2024-05-09 MED ORDER — SUCRALFATE 1 GM/10ML PO SUSP
1.0000 g | Freq: Four times a day (QID) | ORAL | 0 refills | Status: DC
  Filled 2024-05-09: qty 200, 5d supply, fill #0

## 2024-05-09 MED ORDER — CELECOXIB 200 MG PO CAPS
200.0000 mg | ORAL_CAPSULE | Freq: Every day | ORAL | 1 refills | Status: DC
  Filled 2024-05-09 – 2024-06-05 (×3): qty 30, 30d supply, fill #0
  Filled 2024-07-02: qty 30, 30d supply, fill #1

## 2024-05-10 ENCOUNTER — Other Ambulatory Visit (HOSPITAL_COMMUNITY): Payer: Self-pay

## 2024-05-10 MED ORDER — SUCRALFATE 1 G PO TABS
1.0000 g | ORAL_TABLET | Freq: Four times a day (QID) | ORAL | 0 refills | Status: AC
  Filled 2024-05-10: qty 40, 10d supply, fill #0

## 2024-05-27 ENCOUNTER — Other Ambulatory Visit: Payer: Self-pay

## 2024-05-27 ENCOUNTER — Other Ambulatory Visit (HOSPITAL_COMMUNITY): Payer: Self-pay

## 2024-05-28 ENCOUNTER — Other Ambulatory Visit: Payer: Self-pay

## 2024-05-29 ENCOUNTER — Other Ambulatory Visit: Payer: Self-pay

## 2024-06-05 ENCOUNTER — Other Ambulatory Visit (HOSPITAL_COMMUNITY): Payer: Self-pay

## 2024-06-05 ENCOUNTER — Other Ambulatory Visit: Payer: Self-pay

## 2024-06-05 MED ORDER — FENOFIBRATE 160 MG PO TABS
160.0000 mg | ORAL_TABLET | Freq: Every day | ORAL | 0 refills | Status: AC
  Filled 2024-06-05: qty 90, 90d supply, fill #0

## 2024-06-07 ENCOUNTER — Other Ambulatory Visit: Payer: Self-pay

## 2024-06-25 ENCOUNTER — Other Ambulatory Visit: Payer: Self-pay

## 2024-06-30 ENCOUNTER — Other Ambulatory Visit (HOSPITAL_COMMUNITY): Payer: Self-pay

## 2024-07-01 ENCOUNTER — Ambulatory Visit: Admitting: Urology

## 2024-07-02 ENCOUNTER — Other Ambulatory Visit: Payer: Self-pay

## 2024-07-03 ENCOUNTER — Other Ambulatory Visit (HOSPITAL_COMMUNITY): Payer: Self-pay

## 2024-07-03 ENCOUNTER — Ambulatory Visit: Admitting: Urology

## 2024-07-03 ENCOUNTER — Other Ambulatory Visit: Payer: Self-pay

## 2024-07-03 VITALS — BP 131/82 | HR 62

## 2024-07-03 DIAGNOSIS — N486 Induration penis plastica: Secondary | ICD-10-CM | POA: Diagnosis not present

## 2024-07-03 MED ORDER — COLLAGENASE CLOSTRID HISTOLYT 0.9 MG IJ SOLR
0.9000 mg | Freq: Once | INTRAMUSCULAR | Status: AC
  Administered 2024-07-03: 0.9 mg via INTRAMUSCULAR

## 2024-07-03 MED ORDER — PHENYLEPHRINE 100 MCG/ML FOR PRIAPISM (OUTPATIENT ~~LOC~~ UROLOGY USE ONLY)
100.0000 ug | Freq: Once | INTRAMUSCULAR | Status: AC
  Administered 2024-07-03: 100 ug via INTRACAVERNOUS

## 2024-07-03 MED ORDER — NONFORMULARY OR COMPOUNDED ITEM
1.0000 mL | Freq: Once | Status: AC
  Administered 2024-07-03: 1 mL via INTRACAVERNOUS

## 2024-07-03 MED ORDER — CELECOXIB 200 MG PO CAPS
200.0000 mg | ORAL_CAPSULE | Freq: Every day | ORAL | 2 refills | Status: AC
  Filled 2024-07-03: qty 90, 90d supply, fill #0

## 2024-07-03 NOTE — Progress Notes (Unsigned)
 Xiaflex  injection:  Procedure:  Concentration and amount of PGE-1 injected: 1cc of 10g/CC  Approximate size and location of plaque:  0.5cm proximal dorsal penile shaft  Degree of curvature: ***  Neo-Synephrine 150 g brought brisk detumescence.  Injection procedure:  Using sterile technique an alcohol swab was used to clean the area of injection. The location had been previously marked. A 27-gauge needle was inserted through the skin and into the plaque at the point of maximum concavity with the needle oriented perpendicular to the corpus cavernosum. The needle was advanced transversely through the width of the plaque toward the opposite side of the plaque without passing completely through it. Proper position was confirmed by noting resistance to minimal depression of the syringe plunger. I then began injecting maintaining steady pressure as I withdrew the needle slowly through the transverse width of the plaque depositing the 80mg  of Xiaflex  solution 10mg  wastage. The needle was then withdrawn and gentle pressure was applied at the injection site.    He tolerated his injection well. I will therefore have him return in 48 hours for repeat injection.

## 2024-07-04 ENCOUNTER — Encounter: Payer: Self-pay | Admitting: Urology

## 2024-07-04 NOTE — Patient Instructions (Signed)
 Collagenase Injection (Dupuytren Contracture/Peyronie Disease) What is this medication? COLLAGENASE (kohl LAH jen ace) treats conditions caused by thickening of tissue in your body. It works by breaking down excess collagen in the tissue, which reduces stiffness and tightness. This medicine may be used for other purposes; ask your health care provider or pharmacist if you have questions. COMMON BRAND NAME(S): Xiaflex What should I tell my care team before I take this medication? They need to know if you have any of these conditions: Bleeding disorder An unusual or allergic reaction to collagenase, other medications, foods, dyes, or preservatives Pregnant or trying to get pregnant Breast-feeding How should I use this medication? This medication is injected into the affected area. It is given by your care team in a hospital or clinic setting. A special MedGuide will be given to you by the pharmacist with each prescription and refill. Be sure to read this information carefully each time. Talk to your care team about the use of this medication in children. Special care may be needed. Overdosage: If you think you have taken too much of this medicine contact a poison control center or emergency room at once. NOTE: This medicine is only for you. Do not share this medicine with others. What if I miss a dose? Keep appointments for follow-up doses. It is important not to miss your dose. Call your care team if you are unable to keep an appointment. What may interact with this medication? Aspirin and aspirin-like medications Certain medications that treat or prevent blood clots, such as warfarin, enoxaparin, dalteparin, apixaban, dabigatran, rivaroxaban This list may not describe all possible interactions. Give your health care provider a list of all the medicines, herbs, non-prescription drugs, or dietary supplements you use. Also tell them if you smoke, drink alcohol, or use illegal drugs. Some items may  interact with your medicine. What should I watch for while using this medication? Your condition will be monitored carefully while you are receiving this medication. If medication is for Dupuytren's Contracture, visit your care team 1 to 3 days after the injection. Until you visit your care team, do not flex or extend the fingers of your hand that was injected. Do not touch your finger that was injected. Elevate your hand until bedtime. Do not perform activity with the injected hand until you are told that it is ok. Follow any instructions about wearing a splint or performing finger exercises. Contact your care team as soon as possible if you get increasing redness or swelling in the hand, have numbness or tingling in the treated finger, or have trouble bending the finger after the swelling goes down. If medication is for Peyronie's disease, do not have sex between the first and second injections. Wait 4 weeks after the second injection and when there is no more pain or swelling in the penis to have sex. Avoid using vacuum erection devices during treatment with this medication. Try to avoid straining stomach muscles such as during bowel movements. Your care team will give you instructions on how to perform modeling activities at home. Contact your care team as soon as possible if you have severe pain or swelling in the penis, severe purple bruising and swelling of the penis, trouble passing urine, blood in urine, popping or cracking sound form the penis, or sudden loss of ability to maintain an erection. What side effects may I notice from receiving this medication? Side effects that you should report to your care team as soon as possible: Allergic reactions--skin rash,  itching, hives, swelling of the face, lips, tongue, or throat Feeling faint or lightheaded Skin infection--skin redness, swelling, warmth, or pain Severe back pain, chest pain, headache, trouble breathing after injection Snap or pop that  you feel or hear, severe pain, numbness, swelling, or bruising of or trouble moving in area where injected Side effects that usually do not require medical attention (report to your care team if they continue or are bothersome): Pain, redness, or irritation at injection site This list may not describe all possible side effects. Call your doctor for medical advice about side effects. You may report side effects to FDA at 1-800-FDA-1088. Where should I keep my medication? This medication is given in a hospital or clinic. It will not be stored at home. NOTE: This sheet is a summary. It may not cover all possible information. If you have questions about this medicine, talk to your doctor, pharmacist, or health care provider.  2024 Elsevier/Gold Standard (2021-05-07 00:00:00)

## 2024-07-05 ENCOUNTER — Ambulatory Visit: Admitting: Urology

## 2024-07-05 ENCOUNTER — Encounter: Payer: Self-pay | Admitting: Urology

## 2024-07-05 VITALS — BP 130/79 | HR 66

## 2024-07-05 DIAGNOSIS — N486 Induration penis plastica: Secondary | ICD-10-CM

## 2024-07-05 MED ORDER — COLLAGENASE CLOSTRID HISTOLYT 0.9 MG IJ SOLR
0.9000 mg | Freq: Once | INTRAMUSCULAR | Status: AC
  Administered 2024-07-05: 0.9 mg via INTRAMUSCULAR

## 2024-07-05 NOTE — Progress Notes (Signed)
Xiaflex second injection:  Injection procedure:  Using sterile technique an alcohol swab was used to clean the area of injection. The location had been previously marked. A 27-gauge needle was inserted through the skin and into the plaque at the point of maximum concavity with the needle oriented perpendicular to the corpus cavernosum. The needle was advanced transversely through the width of the plaque toward the opposite side of the plaque without passing completely through it. Proper position was confirmed by noting resistance to minimal depression of the syringe plunger. I then began injecting maintaining steady pressure as I withdrew the needle slowly through the transverse width of the plaque depositing  0.25 cc  of Xiaflex solution 0cc wastage. The needle was then withdrawn and gentle pressure was applied at the injection site.   He tolerated his injection well. .  

## 2024-07-05 NOTE — Patient Instructions (Signed)
 Collagenase Injection (Dupuytren Contracture/Peyronie Disease) What is this medication? COLLAGENASE (kohl LAH jen ace) treats conditions caused by thickening of tissue in your body. It works by breaking down excess collagen in the tissue, which reduces stiffness and tightness. This medicine may be used for other purposes; ask your health care provider or pharmacist if you have questions. COMMON BRAND NAME(S): Xiaflex What should I tell my care team before I take this medication? They need to know if you have any of these conditions: Bleeding disorder An unusual or allergic reaction to collagenase, other medications, foods, dyes, or preservatives Pregnant or trying to get pregnant Breast-feeding How should I use this medication? This medication is injected into the affected area. It is given by your care team in a hospital or clinic setting. A special MedGuide will be given to you by the pharmacist with each prescription and refill. Be sure to read this information carefully each time. Talk to your care team about the use of this medication in children. Special care may be needed. Overdosage: If you think you have taken too much of this medicine contact a poison control center or emergency room at once. NOTE: This medicine is only for you. Do not share this medicine with others. What if I miss a dose? Keep appointments for follow-up doses. It is important not to miss your dose. Call your care team if you are unable to keep an appointment. What may interact with this medication? Aspirin and aspirin-like medications Certain medications that treat or prevent blood clots, such as warfarin, enoxaparin, dalteparin, apixaban, dabigatran, rivaroxaban This list may not describe all possible interactions. Give your health care provider a list of all the medicines, herbs, non-prescription drugs, or dietary supplements you use. Also tell them if you smoke, drink alcohol, or use illegal drugs. Some items may  interact with your medicine. What should I watch for while using this medication? Your condition will be monitored carefully while you are receiving this medication. If medication is for Dupuytren's Contracture, visit your care team 1 to 3 days after the injection. Until you visit your care team, do not flex or extend the fingers of your hand that was injected. Do not touch your finger that was injected. Elevate your hand until bedtime. Do not perform activity with the injected hand until you are told that it is ok. Follow any instructions about wearing a splint or performing finger exercises. Contact your care team as soon as possible if you get increasing redness or swelling in the hand, have numbness or tingling in the treated finger, or have trouble bending the finger after the swelling goes down. If medication is for Peyronie's disease, do not have sex between the first and second injections. Wait 4 weeks after the second injection and when there is no more pain or swelling in the penis to have sex. Avoid using vacuum erection devices during treatment with this medication. Try to avoid straining stomach muscles such as during bowel movements. Your care team will give you instructions on how to perform modeling activities at home. Contact your care team as soon as possible if you have severe pain or swelling in the penis, severe purple bruising and swelling of the penis, trouble passing urine, blood in urine, popping or cracking sound form the penis, or sudden loss of ability to maintain an erection. What side effects may I notice from receiving this medication? Side effects that you should report to your care team as soon as possible: Allergic reactions--skin rash,  itching, hives, swelling of the face, lips, tongue, or throat Feeling faint or lightheaded Skin infection--skin redness, swelling, warmth, or pain Severe back pain, chest pain, headache, trouble breathing after injection Snap or pop that  you feel or hear, severe pain, numbness, swelling, or bruising of or trouble moving in area where injected Side effects that usually do not require medical attention (report to your care team if they continue or are bothersome): Pain, redness, or irritation at injection site This list may not describe all possible side effects. Call your doctor for medical advice about side effects. You may report side effects to FDA at 1-800-FDA-1088. Where should I keep my medication? This medication is given in a hospital or clinic. It will not be stored at home. NOTE: This sheet is a summary. It may not cover all possible information. If you have questions about this medicine, talk to your doctor, pharmacist, or health care provider.  2024 Elsevier/Gold Standard (2021-05-07 00:00:00)

## 2024-07-09 ENCOUNTER — Other Ambulatory Visit: Payer: Self-pay

## 2024-07-09 ENCOUNTER — Other Ambulatory Visit (HOSPITAL_COMMUNITY): Payer: Self-pay

## 2024-07-09 MED ORDER — ROSUVASTATIN CALCIUM 10 MG PO TABS
10.0000 mg | ORAL_TABLET | Freq: Every day | ORAL | 2 refills | Status: AC
  Filled 2024-07-09: qty 90, 90d supply, fill #0

## 2024-07-10 ENCOUNTER — Other Ambulatory Visit: Payer: Self-pay

## 2024-10-23 ENCOUNTER — Ambulatory Visit (HOSPITAL_COMMUNITY): Admitting: Internal Medicine
